# Patient Record
Sex: Male | Born: 1990 | Race: Black or African American | Hispanic: No | Marital: Single | Smoking: Former smoker
Health system: Southern US, Community
[De-identification: ages and names within clinical notes are randomized; demographics above are authoritative.]

## PROBLEM LIST (undated history)

## (undated) DIAGNOSIS — M549 Dorsalgia, unspecified: Secondary | ICD-10-CM

## (undated) DIAGNOSIS — E119 Type 2 diabetes mellitus without complications: Secondary | ICD-10-CM

## (undated) DIAGNOSIS — I10 Essential (primary) hypertension: Secondary | ICD-10-CM

## (undated) DIAGNOSIS — J302 Other seasonal allergic rhinitis: Secondary | ICD-10-CM

## (undated) HISTORY — PX: DENTAL SURGERY: SHX609

---
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
## 2013-06-26 ENCOUNTER — Encounter (HOSPITAL_COMMUNITY): Payer: Self-pay | Admitting: Emergency Medicine

## 2013-06-26 ENCOUNTER — Emergency Department (HOSPITAL_COMMUNITY)
Admission: EM | Admit: 2013-06-26 | Discharge: 2013-06-27 | Disposition: A | Discharge status: Home / Self Care | Payer: Self-pay | Attending: Emergency Medicine | Admitting: Emergency Medicine

## 2013-06-26 DIAGNOSIS — F17200 Nicotine dependence, unspecified, uncomplicated: Secondary | ICD-10-CM | POA: Insufficient documentation

## 2013-06-26 DIAGNOSIS — G44209 Tension-type headache, unspecified, not intractable: Secondary | ICD-10-CM

## 2013-06-26 DIAGNOSIS — B9789 Other viral agents as the cause of diseases classified elsewhere: Secondary | ICD-10-CM | POA: Insufficient documentation

## 2013-06-26 DIAGNOSIS — B349 Viral infection, unspecified: Secondary | ICD-10-CM

## 2013-06-26 DIAGNOSIS — M436 Torticollis: Secondary | ICD-10-CM | POA: Insufficient documentation

## 2013-06-26 DIAGNOSIS — R61 Generalized hyperhidrosis: Secondary | ICD-10-CM | POA: Insufficient documentation

## 2013-06-26 DIAGNOSIS — R109 Unspecified abdominal pain: Secondary | ICD-10-CM | POA: Insufficient documentation

## 2013-06-26 DIAGNOSIS — R5381 Other malaise: Secondary | ICD-10-CM | POA: Insufficient documentation

## 2013-06-26 DIAGNOSIS — M549 Dorsalgia, unspecified: Secondary | ICD-10-CM | POA: Insufficient documentation

## 2013-06-26 HISTORY — DX: Other seasonal allergic rhinitis: J30.2

## 2013-06-26 MED ORDER — IBUPROFEN 800 MG PO TABS
800.0000 mg | ORAL_TABLET | Freq: Once | ORAL | Status: AC
  Administered 2013-06-27: 800 mg via ORAL
  Filled 2013-06-26: qty 1
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
## 2013-06-26 NOTE — ED Notes (Signed)
Pt ambulatory to exam room with steady gait. Pt texting on phone. No acute distress.

## 2013-06-26 NOTE — ED Notes (Signed)
Pt c/o vague s/s, HA, nonspecific abd pain, chills, ?fever at home. Pt denies n/v/d. A & O, NAD.

## 2013-06-27 LAB — URINALYSIS, ROUTINE W REFLEX MICROSCOPIC
Leukocytes, UA: NEGATIVE
Nitrite: NEGATIVE
Specific Gravity, Urine: 1.024 (ref 1.005–1.030)
Urobilinogen, UA: 0.2 mg/dL (ref 0.0–1.0)

## 2013-06-27 LAB — CBC
HCT: 41.8 % (ref 39.0–52.0)
Hemoglobin: 13.7 g/dL (ref 13.0–17.0)
Platelets: 283 10*3/uL (ref 150–400)
RBC: 5.83 MIL/uL — ABNORMAL HIGH (ref 4.22–5.81)

## 2013-06-28 NOTE — ED Provider Notes (Signed)
Medical screening examination/treatment/procedure(s) were performed by non-physician practitioner and as supervising physician I was immediately available for consultation/collaboration.  Media Pizzini M Xandria Gallaga, MD 06/28/13 2039 

## 2013-07-07 NOTE — ED Provider Notes (Signed)
Medical screening examination/treatment/procedure(s) were performed by non-physician practitioner and as supervising physician I was immediately available for consultation/collaboration.   Chaneka Trefz M Evelean Bigler, MD 07/07/13 1337 

## 2013-07-17 ENCOUNTER — Emergency Department (INDEPENDENT_AMBULATORY_CARE_PROVIDER_SITE_OTHER)
Admission: EM | Admit: 2013-07-17 | Discharge: 2013-07-17 | Disposition: A | Discharge status: Home / Self Care | Payer: Self-pay | Source: Home / Self Care | Attending: Emergency Medicine | Admitting: Emergency Medicine

## 2013-07-17 ENCOUNTER — Encounter (HOSPITAL_COMMUNITY): Payer: Self-pay | Admitting: Emergency Medicine

## 2013-07-17 DIAGNOSIS — M545 Low back pain, unspecified: Secondary | ICD-10-CM

## 2013-07-17 MED ORDER — TRAMADOL HCL 50 MG PO TABS: 100.0000 mg | ORAL_TABLET | Freq: Three times a day (TID) | ORAL | Status: DC | PRN

## 2013-07-17 MED ORDER — METHOCARBAMOL 500 MG PO TABS: 500.0000 mg | ORAL_TABLET | Freq: Three times a day (TID) | ORAL | Status: DC

## 2013-07-17 MED ORDER — MELOXICAM 15 MG PO TABS: 15.0000 mg | ORAL_TABLET | Freq: Every day | ORAL | Status: DC
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
## 2013-07-17 NOTE — Discharge Instructions (Signed)
Do exercises twice daily followed by moist heat for 15 minutes. ° ° ° ° ° °Try to be as active as possible. ° °If no better in 2 weeks, follow up with orthopedist. ° ° °
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
## 2013-08-12 ENCOUNTER — Emergency Department (HOSPITAL_COMMUNITY)
Admission: EM | Admit: 2013-08-12 | Discharge: 2013-08-12 | Disposition: A | Discharge status: Home / Self Care | Payer: Self-pay | Attending: Emergency Medicine | Admitting: Emergency Medicine

## 2013-08-12 ENCOUNTER — Encounter (HOSPITAL_COMMUNITY): Payer: Self-pay | Admitting: Emergency Medicine

## 2013-08-12 ENCOUNTER — Emergency Department (HOSPITAL_COMMUNITY): Payer: Self-pay

## 2013-08-12 DIAGNOSIS — F17200 Nicotine dependence, unspecified, uncomplicated: Secondary | ICD-10-CM | POA: Insufficient documentation

## 2013-08-12 DIAGNOSIS — M25579 Pain in unspecified ankle and joints of unspecified foot: Secondary | ICD-10-CM | POA: Insufficient documentation

## 2013-08-12 DIAGNOSIS — M25572 Pain in left ankle and joints of left foot: Secondary | ICD-10-CM

## 2013-08-12 MED ORDER — IBUPROFEN 800 MG PO TABS: 800.0000 mg | ORAL_TABLET | Freq: Three times a day (TID) | ORAL | Status: DC | PRN

## 2013-08-12 NOTE — ED Notes (Addendum)
Pt states he was wrestling and hurt his right ankle last week. Pt now has a knot on his ankle and the pain has moved up to his right leg.

## 2013-08-12 NOTE — Discharge Instructions (Signed)
Read the information below.  Use the prescribed medication as directed.  Please discuss all new medications with your pharmacist.  You may return to the Emergency Department at any time for worsening condition or any new symptoms that concern you.  If you develop uncontrolled pain, weakness or numbness of the extremity, severe discoloration of the skin, or you are unable to walk, return to the ER for a recheck.    ° ° °Ankle Pain °Ankle pain is a common symptom. The bones, cartilage, tendons, and muscles of the ankle joint perform a lot of work each day. The ankle joint holds your body weight and allows you to move around. Ankle pain can occur on either side or back of 1 or both ankles. Ankle pain may be sharp and burning or dull and aching. There may be tenderness, stiffness, redness, or warmth around the ankle. The pain occurs more often when a person walks or puts pressure on the ankle. °CAUSES  °There are many reasons ankle pain can develop. It is important to work with your caregiver to identify the cause since many conditions can impact the bones, cartilage, muscles, and tendons. Causes for ankle pain include: °· Injury, including a break (fracture), sprain, or strain often due to a fall, sports, or a high-impact activity. °· Swelling (inflammation) of a tendon (tendonitis). °· Achilles tendon rupture. °· Ankle instability after repeated sprains and strains. °· Poor foot alignment. °· Pressure on a nerve (tarsal tunnel syndrome). °· Arthritis in the ankle or the lining of the ankle. °· Crystal formation in the ankle (gout or pseudogout). °DIAGNOSIS  °A diagnosis is based on your medical history, your symptoms, results of your physical exam, and results of diagnostic tests. Diagnostic tests may include X-ray exams or a computerized magnetic scan (magnetic resonance imaging, MRI). °TREATMENT  °Treatment will depend on the cause of your ankle pain and may include: °· Keeping pressure off the ankle and limiting  activities. °· Using crutches or other walking support (a cane or brace). °· Using rest, ice, compression, and elevation. °· Participating in physical therapy or home exercises. °· Wearing shoe inserts or special shoes. °· Losing weight. °· Taking medications to reduce pain or swelling or receiving an injection. °· Undergoing surgery. °HOME CARE INSTRUCTIONS  °· Only take over-the-counter or prescription medicines for pain, discomfort, or fever as directed by your caregiver. °· Put ice on the injured area. °· Put ice in a plastic bag. °· Place a towel between your skin and the bag. °· Leave the ice on for 15-20 minutes at a time, 03-04 times a day. °· Keep your leg raised (elevated) when possible to lessen swelling. °· Avoid activities that cause ankle pain. °· Follow specific exercises as directed by your caregiver. °· Record how often you have ankle pain, the location of the pain, and what it feels like. This information may be helpful to you and your caregiver. °· Ask your caregiver about returning to work or sports and whether you should drive. °· Follow up with your caregiver for further examination, therapy, or testing as directed. °SEEK MEDICAL CARE IF:  °· Pain or swelling continues or worsens beyond 1 week. °· You have an oral temperature above 102° F (38.9° C). °· You are feeling unwell or have chills. °· You are having an increasingly difficult time with walking. °· You have loss of sensation or other new symptoms. °· You have questions or concerns. °MAKE SURE YOU:  °· Understand these instructions. °· Will   watch your condition.  Will get help right away if you are not doing well or get worse. Document Released: 12/15/2009 Document Revised: 09/19/2011 Document Reviewed: 12/15/2009 [facility] Patient Information 2014 [place], [place].   Emergency Department Resource Guide 1) Find a Doctor and Pay Out of Pocket Although you won't have to find out who is covered by your insurance plan, it is a good  idea to ask around and get recommendations. You will then need to call the office and see if the doctor you have chosen will accept you as a new patient and what types of options they offer for patients who are self-pay. Some doctors offer discounts or will set up payment plans for their patients who do not have insurance, but you will need to ask so you aren't surprised when you get to your appointment.  2) Contact Your Local Health Department Not all health departments have doctors that can see patients for sick visits, but many do, so it is worth a call to see if yours does. If you don't know where your local health department is, you can check in your phone book. The CDC also has a tool to help you locate your state's health department, and many state websites also have listings of all of their local health departments.  3) Find a Walk-in Clinic If your illness is not likely to be very severe or complicated, you may want to try a walk in clinic. These are popping up all over the country in pharmacies, drugstores, and shopping centers. They're usually staffed by nurse practitioners or physician assistants that have been trained to treat common illnesses and complaints. They're usually fairly quick and inexpensive. However, if you have serious medical issues or chronic medical problems, these are probably not your best option.  No Primary Care Doctor: - Call Health Connect at  [phone] - they can help you locate a primary care doctor that  accepts your insurance, provides certain services, etc. - Physician Referral Service- [phone]  Chronic Pain Problems: Organization         Address  Phone   Notes  [facility]  [phone] Patients need to be referred by their primary care doctor.   Medication Assistance: Organization         Address  Phone   Notes  [facility] Medication [facility] [address] [place] [place], [place] [address] [phone]  --Must be a resident of [facility] -- Must have NO insurance coverage whatsoever (no Medicaid/ Medicare, etc.) -- The pt. MUST have a primary care doctor that directs their care regularly and follows them in the community   [facility]ist  [phone]   [employer]  [phone]    Agencies that provide inexpensive medical care: Organization         Address  Phone   Notes  [facility]  [phone]   [facility]    [phone]   [facility] [address] [place], [place] [address] [phone]   [facility] [address] [place] [place] [phone]   Planned Parenthood    [phone]   [facility]    [phone]   [facility]  [address], [place] Phone:  [phone], Fax:  [phone] Hours of Operation:  9 am - 6 pm, M-F.  Also accepts Medicaid/Medicare and self-pay.  [facility]  [address], [place] Phone: [phone], Fax: [phone]. Hours of Operation:  8:30 am - 5:30 pm, M-F.  Also accepts Medicaid and self-pay.  [facility] [place] [address] [place] Phone: [phone]   [facility] [address] Natasha Bence [place], [place] [phone], Ext. [phone] Mondays & Thursdays: 7-9 AM.  First 15 patients are seen on a first come, first serve basis.    Medicaid-accepting [facility] Providers:  Organization         Address  Phone   Notes  [facility] [address], [place] [phone] Also accepts self-pay patients.  [facility] [address] Laurell Josephs [place], [place]  [phone]   [facility] [address], [place] [phone]   [facility] [address] [place] [phone]   Renaye Rakers [address], [place]   [phone] Only  accepts [place] patients after they have their name applied to their card.   Self-Pay (no insurance) in [facility]:  Organization         Address  Phone   Notes  Sickle Cell Patients, [facility] [address] [place], [place] [phone]   [facility] [address] [place], [place] [phone]   [facility]  [address], [place] [phone]   [facility]/Dr. Osei-Bonsu  [address] [place] or [address], [place] [phone] Phone number for both [place] and [place] locations is the same.  [facility] and [facility] [address] [place] [phone]   [facility] [address] [place] or [address] [phone]   [facility] [address], [place] [phone], phone; [phone], fax Sees patients 1st and 3rd Saturday of every month.  Must not qualify for public or private insurance (i.e. Medicaid, Medicare, [place] Health Choice, Veterans' Benefits)  Household income should be no more than 200% of the poverty level The clinic cannot treat you if you are pregnant or think you are pregnant  Sexually transmitted diseases are not treated at the clinic.    Dental Care: Organization         Address  Phone  Notes  [facility] Department of [facility] [address] [place], [place] [phone] Accepts children up to age 45 who are enrolled in [place] or [place] Health Choice; pregnant women with a Medicaid card; and children who have applied for Medicaid or [place] Health Choice, but were declined, whose parents can pay a reduced fee at time of service.  [facility]  [address], [place] [phone] Accepts children up to age 58 who are enrolled in [place] or [place] Health Choice; pregnant  women with a Medicaid card; and children who have applied for Medicaid or  Health Choice, but were declined, whose parents can pay a reduced fee at time of service.  [facility]  [address] [place], [place] [phone] Patients are seen by appointment only. Walk-ins are not accepted. [facility] will see patients 41 years of age and older. Monday - Tuesday (8am-5pm) Most Wednesdays (8:30-5pm) $30 per visit, cash only  [facility]  [address], [facility] [phone] Patients are seen by appointment only. Walk-ins are not accepted. [facility] will see patients 92 years of age and older. One Wednesday Evening (Monthly: Volunteer Based).  $30 per visit, cash only  [employer]  [phone] for adults; Children under age 40, call Graduate Pediatric Dentistry at [phone]. Children aged 60-14, please call [phone] to request a pediatric application.  Dental services are provided in all areas of dental care including fillings, crowns and bridges, complete and partial dentures, implants, gum treatment, root canals, and extractions. Preventive care is also provided. Treatment is provided to both adults and children. Patients are selected via a lottery and there is often a waiting list.   [facility] [address], [place]  [phone] [email or web address]   [facility] [address] [place], [place] [phone], Ext. [phone] Second and Fourth Thursday of each month, opens at 6:30 AM; Clinic ends at 9 AM.  Patients are seen on a first-come first-served basis, and a limited number are seen during each clinic.   [facility]  [address] Ether Griffins [place], [place] [phone]   Eligibility Requirements You must have lived in [place], [place], or [place] counties for at least the last three months.   You cannot be eligible for state or federal sponsored  [place], including [employer], [place], or [employer].   You generally cannot be eligible for healthcare insurance through your employer.    How to apply: Eligibility screenings are held every Tuesday and Wednesday afternoon from 1:00 pm until 4:00 pm. You do not need an appointment for the interview!  [facility] [address] [place], [place] [phone]   [facility] Health Department  [phone]   [facility]  [phone]   [facility]  [phone]    Behavioral Health Resources in the Community: Intensive Outpatient Programs Organization         Address  Phone  Notes  [facility] [address], [place], [place] [phone]   [facility] Outpatient [address], [place], [place] [phone]   [facility] [address] Lizandro Spellman [place], [place]  [phone]   [facility] Mental Health [address],  [place], [place] [phone] or [phone]   Substance Abuse Resources Organization         Address  Phone  Notes  [facility]  [phone]   [facility]  [phone]   [facility] [place]  [phone]   Floydene Flock  [phone]   Residential & Outpatient Substance Abuse Program  [phone]   Psychological Services Organization         Address  Phone  Notes  [facility]  [phone]   [facility]  [phone]   [facility] [address] [place] [phone] or [phone]    Mobile Crisis Teams Organization         Address  Phone  Notes  [facility], Mobile Crisis Care Unit  [phone]   [facility]  [address] [place], [place] [phone]   Doristine Locks [address] [place] [phone]    Self-Help/Support Groups Organization         Address  Phone              Notes  [facility] [place] - variety of support groups  336- [phone] Call for more information  Narcotics Anonymous (NA), [facility] [address], [place]  2 meetings at this location   Statisticianesidential Treatment Programs Organization         Address  Phone  Notes  [facility] [address],    [place]  [phone]   [facility]  [address], [place] [phone], [place], [place] [phone]   [facility] [address], [place] [phone] Admissions: 8am-3pm M-F  [facility] [address]    [place], [place] [phone]   [facility] [address], [place], [place] [phone]   [facility] [address]  [place], [place] [phone]   [facility] - Intensive Outpatient [address], [place], [place] [phone]   [facility] ([facility] [address]  [place], [place] [phone] or [phone]   [facility] ([facility]) [address] [place], [place] [phone] Accepts Medicaid  [facility] [address]  [place] [phone] Substance Abuse/Addiction Treatment   [facility] Resources Organization         Address  Phone  Notes  [facility]  [phone]) [phone]   Angie FavaJulie Brannon, PhD [address] [place], [place]   [phone]) [phone] or [phone](336) [phone]   [facility]   [address] [place], [place] [phone]) [phone]   [facility] [address], [place], [place] [phone](336) [phone] Insurance/Medicaid/sponsorship through [facility]  [facility] [address]                                    [place], [place] [phone](336) [phone] Therapy/tele-psych/case  [facility] [address]   [place], [place] [phone]) [phone]    Dr. Lolly MustacheArfeen  [phone](336) [phone]   [facility]  [employer] [facility] 1) [address], [place] 2) [address], [place] 3)  [address], [place] [phone](336) [phone]) [phone]  [phone](336) [phone]   [facility] Child Abuse Hotline [phone]) [phone] or [phone]) [phone] (After Hours)

## 2013-08-12 NOTE — ED Provider Notes (Signed)
CSN: [id number]     Arrival date & time 08/12/13  1108 History   First MD Initiated Contact with Patient 08/12/13 1121     Chief Complaint  Patient presents with  . Ankle Pain  . Leg Pain   (Consider location/radiation/quality/duration/timing/severity/associated sxs/prior Treatment) HPI Patient presents with left ankle pain and small area of swelling over medial aspect.  States it has been hurting for a week.  Pain is constant, worse with palpation and walking.  He thinks he twisted his ankle but does not recall the specifics of the event. Denies fevers, weakness or numbness.  He is not active in sports.    Past Medical History  Diagnosis Date  . Seasonal allergies    History reviewed. No pertinent past surgical history. No family history on file. History  Substance Use Topics  . Smoking status: Current Every Day Smoker -- 1.00 packs/day    Types: Cigarettes  . Smokeless tobacco: Not on file  . Alcohol Use: Yes     Comment: occasional    Review of Systems  Constitutional: Negative for fever.  Musculoskeletal: Positive for arthralgias.  Skin: Negative for color change, pallor and wound.  Allergic/Immunologic: Negative for immunocompromised state.  Neurological: Negative for weakness and numbness.    Allergies  Review of patient's allergies indicates no known allergies.  Home Medications   Current Outpatient Rx  Name  Route  Sig  Dispense  Refill  . traMADol (ULTRAM) 50 MG tablet   Oral   Take 2 tablets (100 mg total) by mouth every 8 (eight) hours as needed.   30 tablet   0    BP 126/76  Pulse 86  Temp(Src) 98 F (36.7 C)  Resp 18  SpO2 100% Physical Exam  Nursing note and vitals reviewed. Constitutional: He appears well-developed and well-nourished. No distress.  HENT:  Head: Normocephalic and atraumatic.  Neck: Neck supple.  Pulmonary/Chest: Effort normal.  Musculoskeletal:       Left ankle: He exhibits no ecchymosis, no deformity, no laceration and  normal pulse. Tenderness. Medial malleolus tenderness found.       Left lower leg: Normal.       Left foot: Normal.       Feet:  Neurological: He is alert.  Skin: He is not diaphoretic.  Ankle is stable.  No pain or laxity of joint with stress in any direction.  Achilles is intact.   ED Course  Procedures (including critical care time) Labs Review Labs Reviewed - No data to display Imaging Review Dg Ankle Complete Left  08/12/2013   CLINICAL DATA:  Ankle pain following twisting injury 5 days ago.  EXAM: LEFT ANKLE COMPLETE - 3+ VIEW  COMPARISON:  Foot radiographs 03/30/2004.  FINDINGS: The mineralization and alignment are normal. There is no evidence of acute fracture or dislocation. The joint spaces are maintained. No focal soft tissue swelling identified.  IMPRESSION: No acute osseous findings.   Electronically Signed   By: Roxy Horseman M.D.   On: 08/12/2013 11:45    EKG Interpretation   None       MDM   1. Left ankle pain    Patient with pain in left ankle following possible twisting injury last week. Xray negative.  Neurovascularly intact.  Joint is stable.  D/C home with NSAIDs, RICE recommendations.  PCP followup.  Discussed result, findings, treatment, and follow up  with patient.  Pt given return precautions.  Pt verbalizes understanding and agrees with plan.  Trixie Dredgemily Niza Soderholm, PA-C 08/12/13 1203

## 2013-08-12 NOTE — Progress Notes (Signed)
[facility] CL provided pt with a list of primary care resources and ACA information.  °

## 2013-08-13 NOTE — ED Provider Notes (Signed)
Medical screening examination/treatment/procedure(s) were performed by non-physician practitioner and as supervising physician I was immediately available for consultation/collaboration.  EKG Interpretation   None         Marguarite Markov S Fount Bahe, MD 08/13/13 1239 

## 2013-10-02 ENCOUNTER — Emergency Department (HOSPITAL_COMMUNITY)
Admission: EM | Admit: 2013-10-02 | Discharge: 2013-10-02 | Disposition: A | Discharge status: Home / Self Care | Payer: Self-pay | Attending: Emergency Medicine | Admitting: Emergency Medicine

## 2013-10-02 ENCOUNTER — Encounter (HOSPITAL_COMMUNITY): Payer: Self-pay | Admitting: Emergency Medicine

## 2013-10-02 DIAGNOSIS — R51 Headache: Secondary | ICD-10-CM | POA: Insufficient documentation

## 2013-10-02 DIAGNOSIS — F1210 Cannabis abuse, uncomplicated: Secondary | ICD-10-CM | POA: Insufficient documentation

## 2013-10-02 DIAGNOSIS — J309 Allergic rhinitis, unspecified: Secondary | ICD-10-CM | POA: Insufficient documentation

## 2013-10-02 DIAGNOSIS — M549 Dorsalgia, unspecified: Secondary | ICD-10-CM | POA: Insufficient documentation

## 2013-10-02 DIAGNOSIS — F17200 Nicotine dependence, unspecified, uncomplicated: Secondary | ICD-10-CM | POA: Insufficient documentation

## 2013-10-02 DIAGNOSIS — G8929 Other chronic pain: Secondary | ICD-10-CM | POA: Insufficient documentation

## 2013-10-02 DIAGNOSIS — R42 Dizziness and giddiness: Secondary | ICD-10-CM | POA: Insufficient documentation

## 2013-10-02 MED ORDER — TRAMADOL HCL 50 MG PO TABS: 50.0000 mg | ORAL_TABLET | Freq: Four times a day (QID) | ORAL | Status: DC | PRN

## 2013-10-02 MED ORDER — MECLIZINE HCL 25 MG PO TABS
25.0000 mg | ORAL_TABLET | Freq: Once | ORAL | Status: AC
  Administered 2013-10-02: 25 mg via ORAL
  Filled 2013-10-02: qty 1

## 2013-10-02 MED ORDER — MECLIZINE HCL 12.5 MG PO TABS: 12.5000 mg | ORAL_TABLET | Freq: Three times a day (TID) | ORAL | Status: DC | PRN

## 2013-10-02 NOTE — ED Provider Notes (Signed)
CSN: [id number]     Arrival date & time 10/02/13  1053 History   First MD Initiated Contact with Patient 10/02/13 1128     Chief Complaint  Patient presents with  . Dizziness  . Back Pain  . Headache     (Consider location/radiation/quality/duration/timing/severity/associated sxs/prior Treatment) HPI Comments: Patient is a 23 year old male with a past medical history of chronic back pain and seasonal allergies who presents to the emergency department complaining of an exacerbation of his chronic back pain over the past few days. Patient states he has had back pain ongoing for the past several months, was put on tramadol which relieved his pain, however he only has a few pills left and is worried what he will do when he runs out. Denies any new injury or trauma. Denies pain, numbness or tingling radiating down his extremities, no loss of control bowels or bladder or saddle anesthesia. Denies fever, chills or night sweats. Also complaining of 2 episodes of dizziness, symptoms are very vague and patient is unable to describe how he is feeling. States when he was sitting watching TV last night, he felt "dizzy", biopsy hasn't been eating drink the dizziness subsided.. Symptoms returned today, he cannot recall any relation to position. Currently he is slightly dizzy. Denies chest pain, shortness of breath, vision change, weakness. No family history of sudden cardiac death.  Patient is a 23 y.o. male presenting with dizziness, back pain, and headaches. The history is provided by the patient.  Dizziness Associated symptoms: headaches   Back Pain Associated symptoms: headaches   Headache Associated symptoms: back pain and dizziness     Past Medical History  Diagnosis Date  . Seasonal allergies    History reviewed. No pertinent past surgical history. History reviewed. No pertinent family history. History  Substance Use Topics  . Smoking status: Current Every Day Smoker -- 1.00 packs/day     Types: Cigarettes  . Smokeless tobacco: Not on file  . Alcohol Use: Yes     Comment: occasional    Review of Systems  Musculoskeletal: Positive for back pain.  Neurological: Positive for dizziness and headaches.  All other systems reviewed and are negative.      Allergies  Review of patient's allergies indicates no known allergies.  Home Medications   Current Outpatient Rx  Name  Route  Sig  Dispense  Refill  . MELOXICAM PO   Oral   Take 1 tablet by mouth daily as needed (muscle spasm).         . traMADol (ULTRAM) 50 MG tablet   Oral   Take 100 mg by mouth every 8 (eight) hours as needed (pain).         . meclizine (ANTIVERT) 12.5 MG tablet   Oral   Take 1 tablet (12.5 mg total) by mouth 3 (three) times daily as needed for dizziness.   30 tablet   0   . traMADol (ULTRAM) 50 MG tablet   Oral   Take 1 tablet (50 mg total) by mouth every 6 (six) hours as needed.   15 tablet   0    BP 140/88  Pulse 79  Temp(Src) 98.1 F (36.7 C) (Oral)  Resp 18  SpO2 100% Physical Exam  Nursing note and vitals reviewed. Constitutional: He is oriented to person, place, and time. He appears well-developed and well-nourished. No distress.  HENT:  Head: Normocephalic and atraumatic.  Mouth/Throat: Oropharynx is clear and moist.  Eyes: Conjunctivae and EOM are normal. Pupils are  equal, round, and reactive to light.  Neck: Normal range of motion. Neck supple. No JVD present.  Cardiovascular: Normal rate, regular rhythm, normal heart sounds and intact distal pulses.   No extremity edema.  Pulmonary/Chest: Effort normal and breath sounds normal. No respiratory distress.  Abdominal: Soft. Bowel sounds are normal. There is no tenderness.  Musculoskeletal: Normal range of motion. He exhibits no edema.  Neurological: He is alert and oriented to person, place, and time. He has normal strength. No sensory deficit. He displays a negative Romberg sign. Coordination and gait normal.   Speech fluent, goal oriented. Moves limbs without ataxia. Equal grip strength bilateral. Strength upper and lower extremities 5/5 and equal bilateral. Normal gait. Unable to reproduce dizziness.  Skin: Skin is warm and dry. He is not diaphoretic.  Psychiatric: He has a normal mood and affect. His behavior is normal.    ED Course  Procedures (including critical care time) Labs Review Labs Reviewed - No data to display Imaging Review No results found.   EKG Interpretation   Date/Time:  Wednesday October 02 2013 13:28:53 EDT Ventricular Rate:  78 PR Interval:  153 QRS Duration: 79 QT Interval:  362 QTC Calculation: 412 R Axis:   66 Text Interpretation:  Sinus rhythm No previous ECGs available Confirmed by  ZACKOWSKI  MD, SCOTT ([id number]) on 10/02/2013 1:38:55 PM      MDM   Final diagnoses:  Dizziness  Chronic back pain   Pt presenting with dizziness and chronic back pain. He is well appearing and in NAD. Needs refill on tramadol, he does not have PCP. Regarding dizziness, very vague, not reproducible. EKG unremarkable. Symptoms resolved with meclizine. Normal neuro exam, non-focal. Doubt central vertigo. No nystagmus. Pt stable for discharge, will d/c with meclizine and tramadol. Advised pt to f/u with PCP, [facility]. Return precautions given. Patient states understanding of treatment care plan and is agreeable.   Trevor MaceRobyn M Albert, PA-C 10/02/13 1409

## 2013-10-02 NOTE — ED Notes (Signed)
Pt c/o generalized back pain that is chronic in nature x several months; pt sts generalized HA today

## 2013-10-02 NOTE — ED Provider Notes (Signed)
Medical screening examination/treatment/procedure(s) were performed by non-physician practitioner and as supervising physician I was immediately available for consultation/collaboration.   EKG Interpretation   Date/Time:  Wednesday October 02 2013 13:28:53 EDT Ventricular Rate:  78 PR Interval:  153 QRS Duration: 79 QT Interval:  362 QTC Calculation: 412 R Axis:   66 Text Interpretation:  Sinus rhythm No previous ECGs available Confirmed by  Deretha EmoryZACKOWSKI  MD, Sonya Gunnoe ([id number]) on 10/02/2013 1:38:55 PM        Shelda JakesScott W. Jermale Crass, MD 10/02/13 [phone]

## 2013-10-02 NOTE — Discharge Instructions (Signed)
Take meclizine and tramadol as prescribed as needed for dizziness and back pain respectively.  Back Pain, Adult Low back pain is very common. About 1 in 5 people have back pain.The cause of low back pain is rarely dangerous. The pain often gets better over time.About half of people with a sudden onset of back pain feel better in just 2 weeks. About 8 in 10 people feel better by 6 weeks.  CAUSES Some common causes of back pain include:  Strain of the muscles or ligaments supporting the spine.  Wear and tear (degeneration) of the spinal discs.  Arthritis.  Direct injury to the back. DIAGNOSIS Most of the time, the direct cause of low back pain is not known.However, back pain can be treated effectively even when the exact cause of the pain is unknown.Answering your caregiver's questions about your overall health and symptoms is one of the most accurate ways to make sure the cause of your pain is not dangerous. If your caregiver needs more information, he or she may order lab work or imaging tests (X-rays or MRIs).However, even if imaging tests show changes in your back, this usually does not require surgery. HOME CARE INSTRUCTIONS For many people, back pain returns.Since low back pain is rarely dangerous, it is often a condition that people can learn to [facility] their own.   Remain active. It is stressful on the back to sit or stand in one place. Do not sit, drive, or stand in one place for more than 30 minutes at a time. Take short walks on level surfaces as soon as pain allows.Try to increase the length of time you walk each day.  Do not stay in bed.Resting more than 1 or 2 days can delay your recovery.  Do not avoid exercise or work.Your body is made to move.It is not dangerous to be active, even though your back may hurt.Your back will likely heal faster if you return to being active before your pain is gone.  Pay attention to your body when you bend and lift. Many people have  less discomfortwhen lifting if they bend their knees, keep the load close to their bodies,and avoid twisting. Often, the most comfortable positions are those that put less stress on your recovering back.  Find a comfortable position to sleep. Use a firm mattress and lie on your side with your knees slightly bent. If you lie on your back, put a pillow under your knees.  Only take over-the-counter or prescription medicines as directed by your caregiver. Over-the-counter medicines to reduce pain and inflammation are often the most helpful.Your caregiver may prescribe muscle relaxant drugs.These medicines help dull your pain so you can more quickly return to your normal activities and healthy exercise.  Put ice on the injured area.  Put ice in a plastic bag.  Place a towel between your skin and the bag.  Leave the ice on for 15-20 minutes, 03-04 times a day for the first 2 to 3 days. After that, ice and heat may be alternated to reduce pain and spasms.  Ask your caregiver about trying back exercises and gentle massage. This may be of some benefit.  Avoid feeling anxious or stressed.Stress increases muscle tension and can worsen back pain.It is important to recognize when you are anxious or stressed and learn ways to manage it.Exercise is a great option. SEEK MEDICAL CARE IF:  You have pain that is not relieved with rest or medicine.  You have pain that does not improve in  1 week.  You have new symptoms.  You are generally not feeling well. SEEK IMMEDIATE MEDICAL CARE IF:   You have pain that radiates from your back into your legs.  You develop new bowel or bladder control problems.  You have unusual weakness or numbness in your arms or legs.  You develop nausea or vomiting.  You develop abdominal pain.  You feel faint. Document Released: 06/27/2005 Document Revised: 12/27/2011 Document Reviewed: 11/15/2010 [facility] Patient Information 2014 [place].  Dizziness Dizziness is a common problem. It is a feeling of unsteadiness or lightheadedness. You may feel like you are about to faint. Dizziness can lead to injury if you stumble or fall. A person of any age group can suffer from dizziness, but dizziness is more common in older adults. CAUSES  Dizziness can be caused by many different things, including:  Middle ear problems.  Standing for too long.  Infections.  An allergic reaction.  Aging.  An emotional response to something, such as the sight of blood.  Side effects of medicines.  Fatigue.  Problems with circulation or blood pressure.  Excess use of alcohol, medicines, or illegal drug use.  Breathing too fast (hyperventilation).  An arrhythmia or problems with your heart rhythm.  Low red blood cell count (anemia).  Pregnancy.  Vomiting, diarrhea, fever, or other illnesses that cause dehydration.  Diseases or conditions such as Parkinson's disease, high blood pressure (hypertension), diabetes, and thyroid problems.  Exposure to extreme heat. DIAGNOSIS  To find the cause of your dizziness, your caregiver may do a physical exam, lab tests, radiologic imaging scans, or an electrocardiography test (ECG).  TREATMENT  Treatment of dizziness depends on the cause of your symptoms and can vary greatly. HOME CARE INSTRUCTIONS   Drink enough fluids to keep your urine clear or pale yellow. This is especially important in very hot weather. In the elderly, it is also important in cold weather.  If your dizziness is caused by medicines, take them exactly as directed. When taking blood pressure medicines, it is especially important to get up slowly.  Rise slowly from chairs and steady yourself until you feel okay.  In the morning, first sit up on the side of the bed. When this seems okay, stand slowly while holding onto something until you know your balance is fine.  If you need to stand in one place for a long time, be  sure to move your legs often. Tighten and relax the muscles in your legs while standing.  If dizziness continues to be a problem, have someone stay with you for a day or two. Do this until you feel you are well enough to stay alone. Have the person call your caregiver if he or she notices changes in you that are concerning.  Do not drive or use heavy machinery if you feel dizzy.  Do not drink alcohol. SEEK IMMEDIATE MEDICAL CARE IF:   Your dizziness or lightheadedness gets worse.  You feel nauseous or vomit.  You develop problems with talking, walking, weakness, or using your arms, hands, or legs.  You are not thinking clearly or you have difficulty forming sentences. It may take a friend or family member to determine if your thinking is normal.  You develop chest pain, abdominal pain, shortness of breath, or sweating.  Your vision changes.  You notice any bleeding.  You have side effects from medicine that seems to be getting worse rather than better. MAKE SURE YOU:   Understand these instructions.  Will watch your condition.  Will get help right away if you are not doing well or get worse. Document Released: 12/21/2000 Document Revised: 09/19/2011 Document Reviewed: 01/14/2011 [facility] Patient Information 2014 [place], [place].

## 2013-11-06 ENCOUNTER — Emergency Department (INDEPENDENT_AMBULATORY_CARE_PROVIDER_SITE_OTHER)
Admission: EM | Admit: 2013-11-06 | Discharge: 2013-11-06 | Discharge status: Against Medical Advice | Payer: Self-pay | Source: Home / Self Care | Attending: Family Medicine | Admitting: Family Medicine

## 2013-11-06 NOTE — ED Notes (Signed)
Called patient twice and did not get an answer

## 2013-11-14 ENCOUNTER — Encounter (HOSPITAL_COMMUNITY): Payer: Self-pay | Admitting: Emergency Medicine

## 2013-11-14 ENCOUNTER — Emergency Department (HOSPITAL_COMMUNITY)
Admission: EM | Admit: 2013-11-14 | Discharge: 2013-11-14 | Disposition: A | Discharge status: Home / Self Care | Payer: Self-pay | Attending: Emergency Medicine | Admitting: Emergency Medicine

## 2013-11-14 DIAGNOSIS — G8929 Other chronic pain: Secondary | ICD-10-CM | POA: Insufficient documentation

## 2013-11-14 DIAGNOSIS — M545 Low back pain, unspecified: Secondary | ICD-10-CM | POA: Insufficient documentation

## 2013-11-14 DIAGNOSIS — M549 Dorsalgia, unspecified: Secondary | ICD-10-CM

## 2013-11-14 DIAGNOSIS — F17200 Nicotine dependence, unspecified, uncomplicated: Secondary | ICD-10-CM | POA: Insufficient documentation

## 2013-11-14 DIAGNOSIS — M546 Pain in thoracic spine: Secondary | ICD-10-CM | POA: Insufficient documentation

## 2013-11-14 HISTORY — DX: Dorsalgia, unspecified: M54.9

## 2013-11-14 MED ORDER — MELOXICAM 7.5 MG PO TABS: 15.0000 mg | ORAL_TABLET | Freq: Every day | ORAL | Status: DC

## 2013-11-14 MED ORDER — HYDROCODONE-ACETAMINOPHEN 5-325 MG PO TABS: ORAL_TABLET | ORAL | Status: DC

## 2013-11-14 MED ORDER — CYCLOBENZAPRINE HCL 10 MG PO TABS: 10.0000 mg | ORAL_TABLET | Freq: Two times a day (BID) | ORAL | Status: DC | PRN
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
## 2013-11-14 NOTE — Discharge Instructions (Signed)
Back Pain, Adult Low back pain is very common. About 1 in 5 people have back pain.The cause of low back pain is rarely dangerous. The pain often gets better over time.About half of people with a sudden onset of back pain feel better in just 2 weeks. About 8 in 10 people feel better by 6 weeks.  CAUSES Some common causes of back pain include:  Strain of the muscles or ligaments supporting the spine.  Wear and tear (degeneration) of the spinal discs.  Arthritis.  Direct injury to the back. DIAGNOSIS Most of the time, the direct cause of low back pain is not known.However, back pain can be treated effectively even when the exact cause of the pain is unknown.Answering your caregiver's questions about your overall health and symptoms is one of the most accurate ways to make sure the cause of your pain is not dangerous. If your caregiver needs more information, he or she may order lab work or imaging tests (X-rays or MRIs).However, even if imaging tests show changes in your back, this usually does not require surgery. HOME CARE INSTRUCTIONS For many people, back pain returns.Since low back pain is rarely dangerous, it is often a condition that people can learn to [facility] their own.   Remain active. It is stressful on the back to sit or stand in one place. Do not sit, drive, or stand in one place for more than 30 minutes at a time. Take short walks on level surfaces as soon as pain allows.Try to increase the length of time you walk each day.  Do not stay in bed.Resting more than 1 or 2 days can delay your recovery.  Do not avoid exercise or work.Your body is made to move.It is not dangerous to be active, even though your back may hurt.Your back will likely heal faster if you return to being active before your pain is gone.  Pay attention to your body when you bend and lift. Many people have less discomfortwhen lifting if they bend their knees, keep the load close to their bodies,and  avoid twisting. Often, the most comfortable positions are those that put less stress on your recovering back.  Find a comfortable position to sleep. Use a firm mattress and lie on your side with your knees slightly bent. If you lie on your back, put a pillow under your knees.  Only take over-the-counter or prescription medicines as directed by your caregiver. Over-the-counter medicines to reduce pain and inflammation are often the most helpful.Your caregiver may prescribe muscle relaxant drugs.These medicines help dull your pain so you can more quickly return to your normal activities and healthy exercise.  Put ice on the injured area.  Put ice in a plastic bag.  Place a towel between your skin and the bag.  Leave the ice on for 15-20 minutes, 03-04 times a day for the first 2 to 3 days. After that, ice and heat may be alternated to reduce pain and spasms.  Ask your caregiver about trying back exercises and gentle massage. This may be of some benefit.  Avoid feeling anxious or stressed.Stress increases muscle tension and can worsen back pain.It is important to recognize when you are anxious or stressed and learn ways to manage it.Exercise is a great option. SEEK MEDICAL CARE IF:  You have pain that is not relieved with rest or medicine.  You have pain that does not improve in 1 week.  You have new symptoms.  You are generally not feeling well. SEEK  IMMEDIATE MEDICAL CARE IF:   You have pain that radiates from your back into your legs.  You develop new bowel or bladder control problems.  You have unusual weakness or numbness in your arms or legs.  You develop nausea or vomiting.  You develop abdominal pain.  You feel faint. Document Released: 06/27/2005 Document Revised: 12/27/2011 Document Reviewed: 11/15/2010 [facility] Patient Information 2014 [place], [place].  Back Exercises These exercises may help you when beginning to rehabilitate your injury. Your symptoms may  resolve with or without further involvement from your physician, physical therapist or athletic trainer. While completing these exercises, remember:   Restoring tissue flexibility helps normal motion to return to the joints. This allows healthier, less painful movement and activity.  An effective stretch should be held for at least 30 seconds.  A stretch should never be painful. You should only feel a gentle lengthening or release in the stretched tissue. STRETCH  Extension, Prone on Elbows   Lie on your stomach on the floor, a bed will be too soft. Place your palms about shoulder width apart and at the height of your head.  Place your elbows under your shoulders. If this is too painful, stack pillows under your chest.  Allow your body to relax so that your hips drop lower and make contact more completely with the floor.  Hold this position for __________ seconds.  Slowly return to lying flat on the floor. Repeat __________ times. Complete this exercise __________ times per day.  RANGE OF MOTION  Extension, Prone Press Ups   Lie on your stomach on the floor, a bed will be too soft. Place your palms about shoulder width apart and at the height of your head.  Keeping your back as relaxed as possible, slowly straighten your elbows while keeping your hips on the floor. You may adjust the placement of your hands to maximize your comfort. As you gain motion, your hands will come more underneath your shoulders.  Hold this position __________ seconds.  Slowly return to lying flat on the floor. Repeat __________ times. Complete this exercise __________ times per day.  RANGE OF MOTION- Quadruped, Neutral Spine   Assume a hands and knees position on a firm surface. Keep your hands under your shoulders and your knees under your hips. You may place padding under your knees for comfort.  Drop your head and point your tail bone toward the ground below you. This will round out your low back like an  angry cat. Hold this position for __________ seconds.  Slowly lift your head and release your tail bone so that your back sags into a large arch, like an old horse.  Hold this position for __________ seconds.  Repeat this until you feel limber in your low back.  Now, find your "sweet spot." This will be the most comfortable position somewhere between the two previous positions. This is your neutral spine. Once you have found this position, tense your stomach muscles to support your low back.  Hold this position for __________ seconds. Repeat __________ times. Complete this exercise __________ times per day.  STRETCH  Flexion, Single Knee to Chest   Lie on a firm bed or floor with both legs extended in front of you.  Keeping one leg in contact with the floor, bring your opposite knee to your chest. Hold your leg in place by either grabbing behind your thigh or at your knee.  Pull until you feel a gentle stretch in your low back. Hold  __________ seconds.  Slowly release your grasp and repeat the exercise with the opposite side. Repeat __________ times. Complete this exercise __________ times per day.  STRETCH - Hamstrings, Standing  Stand or sit and extend your right / left leg, placing your foot on a chair or foot stool  Keeping a slight arch in your low back and your hips straight forward.  Lead with your chest and lean forward at the waist until you feel a gentle stretch in the back of your right / left knee or thigh. (When done correctly, this exercise requires leaning only a small distance.)  Hold this position for __________ seconds. Repeat __________ times. Complete this stretch __________ times per day. STRENGTHENING  Deep Abdominals, Pelvic Tilt   Lie on a firm bed or floor. Keeping your legs in front of you, bend your knees so they are both pointed toward the ceiling and your feet are flat on the floor.  Tense your lower abdominal muscles to press your low back into the  floor. This motion will rotate your pelvis so that your tail bone is scooping upwards rather than pointing at your feet or into the floor.  With a gentle tension and even breathing, hold this position for __________ seconds. Repeat __________ times. Complete this exercise __________ times per day.  STRENGTHENING  Abdominals, Crunches   Lie on a firm bed or floor. Keeping your legs in front of you, bend your knees so they are both pointed toward the ceiling and your feet are flat on the floor. Cross your arms over your chest.  Slightly tip your chin down without bending your neck.  Tense your abdominals and slowly lift your trunk high enough to just clear your shoulder blades. Lifting higher can put excessive stress on the low back and does not further strengthen your abdominal muscles.  Control your return to the starting position. Repeat __________ times. Complete this exercise __________ times per day.  STRENGTHENING  Quadruped, Opposite UE/LE Lift   Assume a hands and knees position on a firm surface. Keep your hands under your shoulders and your knees under your hips. You may place padding under your knees for comfort.  Find your neutral spine and gently tense your abdominal muscles so that you can maintain this position. Your shoulders and hips should form a rectangle that is parallel with the floor and is not twisted.  Keeping your trunk steady, lift your right hand no higher than your shoulder and then your left leg no higher than your hip. Make sure you are not holding your breath. Hold this position __________ seconds.  Continuing to keep your abdominal muscles tense and your back steady, slowly return to your starting position. Repeat with the opposite arm and leg. Repeat __________ times. Complete this exercise __________ times per day. Document Released: 07/15/2005 Document Revised: 09/19/2011 Document Reviewed: 10/09/2008 [facility] Patient Information 2014 [place],  [place].  Emergency Department Resource Guide 1) Find a Doctor and Pay Out of Pocket Although you won't have to find out who is covered by your insurance plan, it is a good idea to ask around and get recommendations. You will then need to call the office and see if the doctor you have chosen will accept you as a new patient and what types of options they offer for patients who are self-pay. Some doctors offer discounts or will set up payment plans for their patients who do not have insurance, but you will need to ask so you aren't surprised when  you get to your appointment.  2) Contact Your Local Health Department Not all health departments have doctors that can see patients for sick visits, but many do, so it is worth a call to see if yours does. If you don't know where your local health department is, you can check in your phone book. The CDC also has a tool to help you locate your state's health department, and many state websites also have listings of all of their local health departments.  3) Find a Walk-in Clinic If your illness is not likely to be very severe or complicated, you may want to try a walk in clinic. These are popping up all over the country in pharmacies, drugstores, and shopping centers. They're usually staffed by nurse practitioners or physician assistants that have been trained to treat common illnesses and complaints. They're usually fairly quick and inexpensive. However, if you have serious medical issues or chronic medical problems, these are probably not your best option.  No Primary Care Doctor: - Call Health Connect at  [phone] - they can help you locate a primary care doctor that  accepts your insurance, provides certain services, etc. - Physician Referral Service- [phone]  Chronic Pain Problems: Organization         Address  Phone   Notes  [facility]  [phone] Patients need to be referred by their primary care doctor.   Medication  Assistance: Organization         Address  Phone   Notes  [facility] Medication [facility] [address] [place], [place] [address] [phone] --Must be a resident of [facility] [place] -- Must have NO insurance coverage whatsoever (no Medicaid/ Medicare, etc.) -- The pt. MUST have a primary care doctor that directs their care regularly and follows them in the community   [facility]ist  [phone]   [employer]  [phone]    Agencies that provide inexpensive medical care: Organization         Address                                                       Phone                                                                            Notes  [facility]  [phone]) [phone]   [facility]    [phone]   [facility] [address] [place], [place] [address] [phone]   [facility] [place] [address], [place] [phone]   [facility]    [phone]) [phone]   [facility]    [phone]   [facility]  [address], [place] Phone:  [phone]) [phone], Fax:  [phone] Hours of Operation:  9 am - 6 pm, M-F.  Also accepts Medicaid/Medicare and self-pay.  [facility]  [address], [place] Phone: [phone],  Fax: [phone]. Hours of Operation:  8:30 am - 5:30 pm, M-F.  Also accepts Medicaid and self-pay.  [facility] [place] [address], [place] Phone: [phone]   [facility] [address], [place], [place] [phone], Ext. [phone] Mondays & Thursdays: 7-9 AM.  First 15 patients are seen on a first come, first serve basis.    Medicaid-accepting [facility] Providers:  Organization         Address                                                                       Phone                               Notes  [facility] [address], [place] [phone]) [phone] Also accepts self-pay patients.  [facility] [address] [place], [place]  [phone]   [facility] [address], [place] [phone]   [facility] [address] [place] [address], [place] [phone]) [phone]   Renaye RakersVeita Bland [address], [place]   [phone] Only accepts [place] Access [place] patients after they have their name applied to their card.   Self-Pay (no insurance) in [facility] [place]:   Organization         Address                                                     Phone               Notes  Sickle Cell Patients, [facility] [address], [place] [phone]   [facility] [address], [place] [phone]   [facility] [place]  [address], [place] [phone]) [phone]   [facility]/Dr. Osei-Bonsu  [address] [place] [address], [place] or [phone], [place] [phone]) [phone] Phone number for both [place] and [place] locations is the same.  [facility] [address], [place] [phone]   [facility] [address] [place] [address], [place] or [address] [phone]) [phone]) [phone]   [facility] [address], [place] [phone], phone; [phone], fax Sees patients 1st and 3rd Saturday of every month.  Must not qualify for public or private insurance (i.e. Medicaid, Medicare, [place] Health Choice, Veterans' Benefits)  Household income should be no more than 200% of the poverty level The clinic cannot treat you if you are pregnant or think you are pregnant  Sexually transmitted diseases are not treated at the clinic.    Dental Care: Organization         Address  Phone                       Notes  [facility] Department of Amb[facility] Center  [facility] [address] [place], [place] [phone] Accepts children up to age 15 who are enrolled in [place] or [place] Health Choice; pregnant women with a Medicaid card; and children who have applied for Medicaid or [place] Health Choice, but were declined, whose parents can pay a reduced fee at time of service.  [facility] Department of [facility]  [address], [place] [phone] Accepts children up to age 39 who are enrolled in [place] or [place] Health Choice; pregnant women with a Medicaid card; and children who have applied for Medicaid or  Health Choice, but were declined, whose parents can pay a reduced fee at time of service.  [facility]  [address] [place], [place] [phone] Patients are seen by appointment only. Walk-ins are not accepted. [facility] will see patients 24 years of age and older. Monday - Tuesday (8am-5pm) Most Wednesdays (8:30-5pm) $30 per visit, cash only  [facility] Adult Dental Access PROGRAM  [address], [facility] [phone] Patients are seen by appointment only. Walk-ins are not accepted. [facility] will see patients 1 years of age and older. One Wednesday Evening (Monthly: Volunteer Based).  $30 per visit, cash only  [employer]  [phone] for adults; Children under age 59, call Graduate Pediatric Dentistry at [phone]. Children aged 96-14, please call [phone] to request a pediatric application.  Dental services are provided in all areas of dental care including fillings, crowns and bridges, complete and partial dentures, implants, gum treatment, root canals, and extractions. Preventive care is also provided. Treatment is provided to both adults and children. Patients are selected via a lottery and there is often a waiting list.   [facility] [address], [place]  [phone] [email or web address]   [facility] [address] [place], [place] [phone], Ext. [phone] Second and Fourth Thursday of each month, opens at 6:30 AM; Clinic ends at 9 AM.  Patients are seen on a first-come first-served basis, and a limited number are seen during each clinic.   [facility]  [address] [place], [place] [phone]   Eligibility Requirements You must have lived in [place], [place], or [place] counties for at least the last three months.   You cannot be eligible for state or federal sponsored [place], including [employer], [place], or [employer].   You generally cannot be eligible for healthcare insurance through your employer.    How to apply: Eligibility screenings are held every Tuesday and Wednesday afternoon from 1:00 pm until 4:00 pm. You do not need an appointment for the interview!  [facility] [address] [place], [place] [phone]   [facility]  [phone]   [facility]  [phone]   [facility]  [phone]

## 2013-11-14 NOTE — ED Provider Notes (Signed)
Medical screening examination/treatment/procedure(s) were performed by non-physician practitioner and as supervising physician I was immediately available for consultation/collaboration.   EKG Interpretation None       Malasia Torain R. Querida Beretta, MD 11/14/13 1556 
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
## 2013-12-04 ENCOUNTER — Encounter (HOSPITAL_COMMUNITY): Payer: Self-pay | Admitting: Emergency Medicine

## 2013-12-04 ENCOUNTER — Emergency Department (HOSPITAL_COMMUNITY)
Admission: EM | Admit: 2013-12-04 | Discharge: 2013-12-04 | Disposition: A | Discharge status: Home / Self Care | Payer: Self-pay | Attending: Emergency Medicine | Admitting: Emergency Medicine

## 2013-12-04 DIAGNOSIS — F17200 Nicotine dependence, unspecified, uncomplicated: Secondary | ICD-10-CM | POA: Insufficient documentation

## 2013-12-04 DIAGNOSIS — R197 Diarrhea, unspecified: Secondary | ICD-10-CM | POA: Insufficient documentation

## 2013-12-04 DIAGNOSIS — R109 Unspecified abdominal pain: Secondary | ICD-10-CM | POA: Insufficient documentation

## 2013-12-04 DIAGNOSIS — R51 Headache: Secondary | ICD-10-CM | POA: Insufficient documentation

## 2013-12-04 MED ORDER — LOPERAMIDE HCL 2 MG PO CAPS
4.0000 mg | ORAL_CAPSULE | Freq: Once | ORAL | Status: AC
Start: 2013-12-04 — End: 2013-12-04
  Administered 2013-12-04: 4 mg via ORAL
  Filled 2013-12-04: qty 2

## 2013-12-04 NOTE — Discharge Instructions (Signed)
Avoid milk until the diarrhea is gone. Take imodium OTC for diarrhea, follow the instructions on the box. Drink plenty of fluids today, like gatorade. Return to the ED if your abdominal pain gets worse, you see blood in your diarrhea, or your diarrhea gets worse.  Diarrhea Diarrhea is frequent loose and watery bowel movements. It can cause you to feel weak and dehydrated. Dehydration can cause you to become tired and thirsty, have a dry mouth, and have decreased urination that often is dark yellow. Diarrhea is a sign of another problem, most often an infection that will not last long. In most cases, diarrhea typically lasts 2 3 days. However, it can last longer if it is a sign of something more serious. It is important to treat your diarrhea as directed by your caregive to lessen or prevent future episodes of diarrhea. CAUSES  Some common causes include:  Gastrointestinal infections caused by viruses, bacteria, or parasites.  Food poisoning or food allergies.  Certain medicines, such as antibiotics, chemotherapy, and laxatives.  Artificial sweeteners and fructose.  Digestive disorders. HOME CARE INSTRUCTIONS  Ensure adequate fluid intake (hydration): have 1 cup (8 oz) of fluid for each diarrhea episode. Avoid fluids that contain simple sugars or sports drinks, fruit juices, whole milk products, and sodas. Your urine should be clear or pale yellow if you are drinking enough fluids. Hydrate with an oral rehydration solution that you can purchase at pharmacies, retail stores, and online. You can prepare an oral rehydration solution at home by mixing the following ingredients together:    tsp table salt.   tsp baking soda.   tsp salt substitute containing potassium chloride.  1  tablespoons sugar.  1 L (34 oz) of water.  Certain foods and beverages may increase the speed at which food moves through the gastrointestinal (GI) tract. These foods and beverages should be avoided and  include:  Caffeinated and alcoholic beverages.  High-fiber foods, such as raw fruits and vegetables, nuts, seeds, and whole grain breads and cereals.  Foods and beverages sweetened with sugar alcohols, such as xylitol, sorbitol, and mannitol.  Some foods may be well tolerated and may help thicken stool including:  Starchy foods, such as rice, toast, pasta, low-sugar cereal, oatmeal, grits, baked potatoes, crackers, and bagels.  Bananas.  Applesauce.  Add probiotic-rich foods to help increase healthy bacteria in the GI tract, such as yogurt and fermented milk products.  Wash your hands well after each diarrhea episode.  Only take over-the-counter or prescription medicines as directed by your caregiver.  Take a warm bath to relieve any burning or pain from frequent diarrhea episodes. SEEK IMMEDIATE MEDICAL CARE IF:   You are unable to keep fluids down.  You have persistent vomiting.  You have blood in your stool, or your stools are black and tarry.  You do not urinate in 6 8 hours, or there is only a small amount of very dark urine.  You have abdominal pain that increases or localizes.  You have weakness, dizziness, confusion, or lightheadedness.  You have a severe headache.  Your diarrhea gets worse or does not get better.  You have a fever or persistent symptoms for more than 2 3 days.  You have a fever and your symptoms suddenly get worse. MAKE SURE YOU:   Understand these instructions.  Will watch your condition.  Will get help right away if you are not doing well or get worse. Document Released: 06/17/2002 Document Revised: 06/13/2012 Document Reviewed: 03/04/2012 [employer] Patient  Information ©2014 [employer]. ° °

## 2013-12-04 NOTE — ED Provider Notes (Signed)
CSN: [id number]     Arrival date & time 12/04/13  1124 History   First MD Initiated Contact with Patient 12/04/13 1149     Chief Complaint  Patient presents with  . Diarrhea     (Consider location/radiation/quality/duration/timing/severity/associated sxs/prior Treatment) HPI Patient reports last night about 10 PM he started having some upper and lower abdominal discomfort that he describes as being achy. He states he ate 2 small chicken wings thinking he was just hungry. He relates at 3 AM he had 3 episodes of loose diarrhea. He states he thought maybe he was constipated however he does not describe hard stools. He does states it hurt when he had the bowel movements. He denies nausea or vomiting. He denies fever. He states he has a slight headache and last night he felt cold and shivering. He denies feeling dizzy or lightheaded. He denies being around anybody else is ill. He denies eating anything he thinks made him ill. Nothing makes him feel better, nothing makes him feel worse.  PCP none  Past Medical History  Diagnosis Date  . Seasonal allergies   . Back pain    No past surgical history on file. No family history on file. History  Substance Use Topics  . Smoking status: Current Every Day Smoker -- 1.00 packs/day    Types: Cigarettes  . Smokeless tobacco: Not on file  . Alcohol Use: Yes     Comment: occasional  employed Quit smoking years ago Employed at [employer]  Review of Systems  All other systems reviewed and are negative.     Allergies  Review of patient's allergies indicates no known allergies.  Home Medications   Prior to Admission medications   Medication Sig Start Date End Date Taking? Authorizing Provider  meclizine (ANTIVERT) 12.5 MG tablet Take 1 tablet (12.5 mg total) by mouth 3 (three) times daily as needed for dizziness. 10/02/13  Yes Robyn M Albert, PA-C   BP 131/74  Pulse 85  Temp(Src) 97.9 F (36.6 C) (Oral)  Resp 18  SpO2  94%  Vital signs normal   Physical Exam  Nursing note and vitals reviewed. Constitutional: He is oriented to person, place, and time. He appears well-developed and well-nourished.  Non-toxic appearance. He does not appear ill. No distress.  HENT:  Head: Normocephalic and atraumatic.  Right Ear: External ear normal.  Left Ear: External ear normal.  Nose: Nose normal. No mucosal edema or rhinorrhea.  Mouth/Throat: Mucous membranes are normal. No dental abscesses or uvula swelling.  Tongue mildly dry  Eyes: Conjunctivae and EOM are normal. Pupils are equal, round, and reactive to light.  Neck: Normal range of motion and full passive range of motion without pain. Neck supple.  Cardiovascular: Normal rate, regular rhythm and normal heart sounds.  Exam reveals no gallop and no friction rub.   No murmur heard. Pulmonary/Chest: Effort normal and breath sounds normal. No respiratory distress. He has no wheezes. He has no rhonchi. He has no rales. He exhibits no tenderness and no crepitus.  Abdominal: Soft. Normal appearance and bowel sounds are normal. He exhibits no distension. There is no tenderness. There is no rebound and no guarding.  Active bowel sounds, has mild tenderness in the upper and lower abdomen without localization, guarding or rebound.  Musculoskeletal: Normal range of motion. He exhibits no edema and no tenderness.  Moves all extremities well.   Neurological: He is alert and oriented to person, place, and time. He has normal strength. No cranial  nerve deficit.  Skin: Skin is warm, dry and intact. No rash noted. No erythema. No pallor.  Psychiatric: He has a normal mood and affect. His speech is normal and behavior is normal. His mood appears not anxious.    ED Course  Procedures (including critical care time)  Medications  loperamide (IMODIUM) capsule 4 mg (not administered)   Pt given option to have IV fluids and labs done, but he does not "like needles". He would prefer  to go home and drink fluids.   Labs Review Labs Reviewed - No data to display  Imaging Review No results found.   EKG Interpretation None      MDM   Final diagnoses:  Abdominal pain  Diarrhea    Plan discharge  Devoria AlbeIva Aodhan Scheidt, MD, Franz DellFACEP     Kamin Niblack L Jevaeh Shams, MD 12/04/13 [phone]

## 2013-12-04 NOTE — ED Notes (Signed)
Pt states he has had 3 episodes of diarrhea since about 0300 this morning.  Pain in upper abd.

## 2013-12-30 ENCOUNTER — Encounter (HOSPITAL_COMMUNITY): Payer: Self-pay | Admitting: Emergency Medicine

## 2013-12-30 ENCOUNTER — Emergency Department (INDEPENDENT_AMBULATORY_CARE_PROVIDER_SITE_OTHER)
Admission: EM | Admit: 2013-12-30 | Discharge: 2013-12-30 | Disposition: A | Discharge status: Home / Self Care | Payer: Self-pay | Source: Home / Self Care | Attending: Family Medicine | Admitting: Family Medicine

## 2013-12-30 DIAGNOSIS — S335XXA Sprain of ligaments of lumbar spine, initial encounter: Secondary | ICD-10-CM

## 2013-12-30 DIAGNOSIS — S39012A Strain of muscle, fascia and tendon of lower back, initial encounter: Secondary | ICD-10-CM

## 2013-12-30 MED ORDER — DICLOFENAC POTASSIUM 50 MG PO TABS: 50.0000 mg | ORAL_TABLET | Freq: Three times a day (TID) | ORAL | Status: DC

## 2013-12-30 NOTE — Discharge Instructions (Signed)
Use ankle brace and medicine as needed, see orthopedist if further problems.

## 2013-12-30 NOTE — ED Provider Notes (Signed)
CSN: [id number]     Arrival date & time 12/30/13  1500 History   First MD Initiated Contact with Patient 12/30/13 1617     Chief Complaint  Patient presents with  . Back Pain   (Consider location/radiation/quality/duration/timing/severity/associated sxs/prior Treatment) Patient is a 23 y.o. male presenting with back pain. The history is provided by the patient.  Back Pain Location:  Lumbar spine Quality:  Stabbing Radiates to:  Does not radiate Pain severity:  Mild Onset quality:  Gradual Duration:  3 days Progression:  Unchanged Chronicity:  Recurrent Context comment:  Diffuse aches and ecchymosis from minor trauma at home and work activity Associated symptoms: leg pain   Associated symptoms: no fever, no pelvic pain and no weakness     Past Medical History  Diagnosis Date  . Seasonal allergies   . Back pain    History reviewed. No pertinent past surgical history. No family history on file. History  Substance Use Topics  . Smoking status: Current Every Day Smoker -- 1.00 packs/day    Types: Cigarettes  . Smokeless tobacco: Not on file  . Alcohol Use: Yes     Comment: occasional    Review of Systems  Constitutional: Negative.  Negative for fever.  Gastrointestinal: Negative.   Genitourinary: Negative.  Negative for pelvic pain.  Musculoskeletal: Positive for back pain. Negative for gait problem, joint swelling and myalgias.  Skin: Negative.   Neurological: Negative for weakness.    Allergies  Review of patient's allergies indicates no known allergies.  Home Medications   Prior to Admission medications   Medication Sig Start Date End Date Taking? Authorizing Provider  diclofenac (CATAFLAM) 50 MG tablet Take 1 tablet (50 mg total) by mouth 3 (three) times daily. For back pain 12/30/13   Linna HoffJames D Tea Collums, MD  meclizine (ANTIVERT) 12.5 MG tablet Take 1 tablet (12.5 mg total) by mouth 3 (three) times daily as needed for dizziness. 10/02/13   Trevor Maceobyn M Albert, PA-C   BP  125/80  Pulse 96  Temp(Src) 98.3 F (36.8 C) (Oral)  Resp 12 Physical Exam  Nursing note and vitals reviewed. Constitutional: He is oriented to person, place, and time. He appears well-developed and well-nourished.  Abdominal: Soft. Bowel sounds are normal.  Musculoskeletal: Normal range of motion. He exhibits no tenderness.  Neurological: He is alert and oriented to person, place, and time.  Nl gait and station, no joint swelling or trauma.  Skin: Skin is warm and dry.    ED Course  Procedures (including critical care time) Labs Review Labs Reviewed - No data to display  Imaging Review No results found.   MDM   1. Strain of lumbar paraspinal muscle, initial encounter        Linna HoffJames D Rasheida Broden, MD 12/30/13 [phone]

## 2013-12-30 NOTE — ED Notes (Signed)
Pt c/o lower back pain, left ankle pain and HA onset 3-4 days Denies recent inj/trauma Attributes pain to work; on feet all day Needing note for work Alert w/no signs of acute disterss.

## 2014-01-25 ENCOUNTER — Encounter (HOSPITAL_COMMUNITY): Payer: Self-pay | Admitting: Emergency Medicine

## 2014-01-25 ENCOUNTER — Emergency Department (HOSPITAL_COMMUNITY)
Admission: EM | Admit: 2014-01-25 | Discharge: 2014-01-25 | Disposition: A | Discharge status: Home / Self Care | Payer: Self-pay | Attending: Emergency Medicine | Admitting: Emergency Medicine

## 2014-01-25 DIAGNOSIS — Z791 Long term (current) use of non-steroidal anti-inflammatories (NSAID): Secondary | ICD-10-CM | POA: Insufficient documentation

## 2014-01-25 DIAGNOSIS — F17200 Nicotine dependence, unspecified, uncomplicated: Secondary | ICD-10-CM | POA: Insufficient documentation

## 2014-01-25 DIAGNOSIS — M545 Low back pain, unspecified: Secondary | ICD-10-CM | POA: Insufficient documentation

## 2014-01-25 DIAGNOSIS — G44209 Tension-type headache, unspecified, not intractable: Secondary | ICD-10-CM | POA: Insufficient documentation

## 2014-01-25 MED ORDER — CYCLOBENZAPRINE HCL 10 MG PO TABS
10.0000 mg | ORAL_TABLET | Freq: Once | ORAL | Status: AC
  Administered 2014-01-25: 10 mg via ORAL
  Filled 2014-01-25: qty 1

## 2014-01-25 MED ORDER — MELOXICAM 15 MG PO TABS: 15.0000 mg | ORAL_TABLET | Freq: Every day | ORAL | Status: DC

## 2014-01-25 MED ORDER — NAPROXEN 500 MG PO TABS
500.0000 mg | ORAL_TABLET | Freq: Once | ORAL | Status: AC
  Administered 2014-01-25: 500 mg via ORAL
  Filled 2014-01-25: qty 1

## 2014-01-25 MED ORDER — CYCLOBENZAPRINE HCL 10 MG PO TABS: 10.0000 mg | ORAL_TABLET | Freq: Two times a day (BID) | ORAL | Status: DC | PRN
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
## 2014-01-25 NOTE — Discharge Instructions (Signed)
Take Mobic as needed for pain. Take Flexeril as needed for muscle spasm. You may take these medications together. Apply heat to your back for pain relief. Refer to attached documents for more information.
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
## 2014-01-25 NOTE — ED Provider Notes (Signed)
Medical screening examination/treatment/procedure(s) were performed by non-physician practitioner and as supervising physician I was immediately available for consultation/collaboration.   EKG Interpretation None        Colden Samaras M Crixus Mcaulay, MD 01/25/14 2347 

## 2014-02-10 ENCOUNTER — Emergency Department (HOSPITAL_COMMUNITY)
Admission: EM | Admit: 2014-02-10 | Discharge: 2014-02-10 | Disposition: A | Discharge status: Home / Self Care | Payer: Self-pay | Attending: Emergency Medicine | Admitting: Emergency Medicine

## 2014-02-10 ENCOUNTER — Encounter (HOSPITAL_COMMUNITY): Payer: Self-pay | Admitting: Emergency Medicine

## 2014-02-10 DIAGNOSIS — F17200 Nicotine dependence, unspecified, uncomplicated: Secondary | ICD-10-CM | POA: Insufficient documentation

## 2014-02-10 DIAGNOSIS — Z79899 Other long term (current) drug therapy: Secondary | ICD-10-CM | POA: Insufficient documentation

## 2014-02-10 DIAGNOSIS — M545 Low back pain, unspecified: Secondary | ICD-10-CM | POA: Insufficient documentation

## 2014-02-10 DIAGNOSIS — R51 Headache: Secondary | ICD-10-CM | POA: Insufficient documentation

## 2014-02-10 DIAGNOSIS — Z8709 Personal history of other diseases of the respiratory system: Secondary | ICD-10-CM | POA: Insufficient documentation

## 2014-02-10 DIAGNOSIS — Z791 Long term (current) use of non-steroidal anti-inflammatories (NSAID): Secondary | ICD-10-CM | POA: Insufficient documentation

## 2014-02-10 DIAGNOSIS — R209 Unspecified disturbances of skin sensation: Secondary | ICD-10-CM | POA: Insufficient documentation

## 2014-02-10 DIAGNOSIS — M549 Dorsalgia, unspecified: Secondary | ICD-10-CM | POA: Insufficient documentation

## 2014-02-10 MED ORDER — METHOCARBAMOL 500 MG PO TABS: 500.0000 mg | ORAL_TABLET | Freq: Two times a day (BID) | ORAL | Status: DC
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
## 2014-02-10 NOTE — Discharge Instructions (Signed)
You may try robaxin instead of flexeril as a muscle relaxer.  Continue mobic (meloxicam) daily for an antiinflammatory.  You may also alternate ice and heat.  Follow up with a primary care provider as needed for continued back pain. See below for further instructions.    Back Exercises These exercises may help you when beginning to rehabilitate your injury. Your symptoms may resolve with or without further involvement from your physician, physical therapist or athletic trainer. While completing these exercises, remember:   Hold stretches for 20-30 seconds, repeat 2-3 times each, perform exercises 2-3 times a day.   Restoring tissue flexibility helps normal motion to return to the joints. This allows healthier, less painful movement and activity.  An effective stretch should be held for at least 30 seconds.  A stretch should never be painful. You should only feel a gentle lengthening or release in the stretched tissue. STRETCH - Extension, Prone on Elbows   Lie on your stomach on the floor, a bed will be too soft. Place your palms about shoulder width apart and at the height of your head.  Place your elbows under your shoulders. If this is too painful, stack pillows under your chest.  Allow your body to relax so that your hips drop lower and make contact more completely with the floor.  Hold this position for __________ seconds.  Slowly return to lying flat on the floor. Repeat __________ times. Complete this exercise __________ times per day.  RANGE OF MOTION - Extension, Prone Press Ups   Lie on your stomach on the floor, a bed will be too soft. Place your palms about shoulder width apart and at the height of your head.  Keeping your back as relaxed as possible, slowly straighten your elbows while keeping your hips on the floor. You may adjust the placement of your hands to maximize your comfort. As you gain motion, your hands will come more underneath your shoulders.  Hold this  position __________ seconds.  Slowly return to lying flat on the floor. Repeat __________ times. Complete this exercise __________ times per day.  RANGE OF MOTION- Quadruped, Neutral Spine   Assume a hands and knees position on a firm surface. Keep your hands under your shoulders and your knees under your hips. You may place padding under your knees for comfort.  Drop your head and point your tail bone toward the ground below you. This will round out your low back like an angry cat. Hold this position for __________ seconds.  Slowly lift your head and release your tail bone so that your back sags into a large arch, like an old horse.  Hold this position for __________ seconds.  Repeat this until you feel limber in your low back.  Now, find your "sweet spot." This will be the most comfortable position somewhere between the two previous positions. This is your neutral spine. Once you have found this position, tense your stomach muscles to support your low back.  Hold this position for __________ seconds. Repeat __________ times. Complete this exercise __________ times per day.  STRETCH - Flexion, Single Knee to Chest   Lie on a firm bed or floor with both legs extended in front of you.  Keeping one leg in contact with the floor, bring your opposite knee to your chest. Hold your leg in place by either grabbing behind your thigh or at your knee.  Pull until you feel a gentle stretch in your low back. Hold __________ seconds.  Slowly  release your grasp and repeat the exercise with the opposite side. Repeat __________ times. Complete this exercise __________ times per day.  STRETCH - Hamstrings, Standing  Stand or sit and extend your right / left leg, placing your foot on a chair or foot stool  Keeping a slight arch in your low back and your hips straight forward.  Lead with your chest and lean forward at the waist until you feel a gentle stretch in the back of your right / left knee or  thigh. (When done correctly, this exercise requires leaning only a small distance.)  Hold this position for __________ seconds. Repeat __________ times. Complete this stretch __________ times per day. STRENGTHENING - Deep Abdominals, Pelvic Tilt   Lie on a firm bed or floor. Keeping your legs in front of you, bend your knees so they are both pointed toward the ceiling and your feet are flat on the floor.  Tense your lower abdominal muscles to press your low back into the floor. This motion will rotate your pelvis so that your tail bone is scooping upwards rather than pointing at your feet or into the floor.  With a gentle tension and even breathing, hold this position for __________ seconds. Repeat __________ times. Complete this exercise __________ times per day.  STRENGTHENING - Abdominals, Crunches   Lie on a firm bed or floor. Keeping your legs in front of you, bend your knees so they are both pointed toward the ceiling and your feet are flat on the floor. Cross your arms over your chest.  Slightly tip your chin down without bending your neck.  Tense your abdominals and slowly lift your trunk high enough to just clear your shoulder blades. Lifting higher can put excessive stress on the low back and does not further strengthen your abdominal muscles.  Control your return to the starting position. Repeat __________ times. Complete this exercise __________ times per day.  STRENGTHENING - Quadruped, Opposite UE/LE Lift   Assume a hands and knees position on a firm surface. Keep your hands under your shoulders and your knees under your hips. You may place padding under your knees for comfort.  Find your neutral spine and gently tense your abdominal muscles so that you can maintain this position. Your shoulders and hips should form a rectangle that is parallel with the floor and is not twisted.  Keeping your trunk steady, lift your right hand no higher than your shoulder and then your left  leg no higher than your hip. Make sure you are not holding your breath. Hold this position __________ seconds.  Continuing to keep your abdominal muscles tense and your back steady, slowly return to your starting position. Repeat with the opposite arm and leg. Repeat __________ times. Complete this exercise __________ times per day. Document Released: 07/15/2005 Document Revised: 09/19/2011 Document Reviewed: 10/09/2008 [facility] Patient Information 2015 [employer], [place]. This information is not intended to replace advice given to you by your health care provider. Make sure you discuss any questions you have with your health care provider.
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
## 2014-02-10 NOTE — ED Provider Notes (Signed)
Medical screening examination/treatment/procedure(s) were performed by non-physician practitioner and as supervising physician I was immediately available for consultation/collaboration.    Vida RollerBrian D Brule, MD 02/10/14 [phone]
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
## 2014-05-07 ENCOUNTER — Encounter (HOSPITAL_COMMUNITY): Payer: Self-pay | Admitting: Emergency Medicine

## 2014-05-07 ENCOUNTER — Emergency Department (HOSPITAL_COMMUNITY)
Admission: EM | Admit: 2014-05-07 | Discharge: 2014-05-07 | Disposition: A | Discharge status: Home / Self Care | Payer: Self-pay | Attending: Emergency Medicine | Admitting: Emergency Medicine

## 2014-05-07 DIAGNOSIS — G8911 Acute pain due to trauma: Secondary | ICD-10-CM | POA: Insufficient documentation

## 2014-05-07 DIAGNOSIS — M545 Low back pain, unspecified: Secondary | ICD-10-CM

## 2014-05-07 DIAGNOSIS — Z79899 Other long term (current) drug therapy: Secondary | ICD-10-CM | POA: Insufficient documentation

## 2014-05-07 DIAGNOSIS — Z720 Tobacco use: Secondary | ICD-10-CM | POA: Insufficient documentation

## 2014-05-07 DIAGNOSIS — Z791 Long term (current) use of non-steroidal anti-inflammatories (NSAID): Secondary | ICD-10-CM | POA: Insufficient documentation

## 2014-05-07 MED ORDER — MELOXICAM 15 MG PO TABS: 15.0000 mg | ORAL_TABLET | Freq: Every day | ORAL | Status: DC

## 2014-05-07 MED ORDER — CYCLOBENZAPRINE HCL 10 MG PO TABS: 10.0000 mg | ORAL_TABLET | Freq: Two times a day (BID) | ORAL | Status: DC | PRN

## 2014-05-07 NOTE — Discharge Instructions (Signed)
Take mobic as needed for pain. Take flexeril as needed for muscle spasm. Refer to attached documents for more information. Return to the ED with worsening or concerning symptoms.  °
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
## 2014-05-07 NOTE — ED Provider Notes (Signed)
Medical screening examination/treatment/procedure(s) were performed by non-physician practitioner and as supervising physician I was immediately available for consultation/collaboration.   EKG Interpretation None       Zyair Rhein, MD 05/07/14 1637 

## 2014-05-07 NOTE — ED Notes (Signed)
Per pt sts that he is having mid and lower back pain. sts also slight HA.

## 2014-05-12 NOTE — Discharge Planning (Signed)
[facility] Eligibility Specialist  Spoke to patient regarding primary care resources and the [facility] orange card. Orange card application and instructions given. [facility], informed him to call the practice to get re-established as a patient. Resource guide and my contact information also given for any future questions or concerns. No other needs identified at this time.

## 2014-05-29 ENCOUNTER — Emergency Department (HOSPITAL_COMMUNITY)
Admission: EM | Admit: 2014-05-29 | Discharge: 2014-05-29 | Disposition: A | Discharge status: Home / Self Care | Payer: Self-pay | Attending: Emergency Medicine | Admitting: Emergency Medicine

## 2014-05-29 ENCOUNTER — Encounter (HOSPITAL_COMMUNITY): Payer: Self-pay | Admitting: Emergency Medicine

## 2014-05-29 DIAGNOSIS — Z720 Tobacco use: Secondary | ICD-10-CM | POA: Insufficient documentation

## 2014-05-29 DIAGNOSIS — J069 Acute upper respiratory infection, unspecified: Secondary | ICD-10-CM | POA: Insufficient documentation

## 2014-05-29 MED ORDER — HYDROCOD POLST-CHLORPHEN POLST 10-8 MG/5ML PO LQCR: 5.0000 mL | Freq: Two times a day (BID) | ORAL | Status: DC | PRN

## 2014-05-29 MED ORDER — IBUPROFEN 800 MG PO TABS: 800.0000 mg | ORAL_TABLET | Freq: Three times a day (TID) | ORAL | Status: DC | PRN

## 2014-05-29 MED ORDER — ALBUTEROL SULFATE HFA 108 (90 BASE) MCG/ACT IN AERS: 1.0000 | INHALATION_SPRAY | Freq: Four times a day (QID) | RESPIRATORY_TRACT | Status: DC | PRN

## 2014-05-29 NOTE — ED Provider Notes (Signed)
CSN: [id number]     Arrival date & time 05/29/14  1316 History   This chart is scribed for non-physician practitioner, Trixie DredgeEmily Madigan Rosensteel, PA-C, working with Derwood KaplanAnkit Nanavati, MD by Abel PrestoKara Demonbreun, ED Scribe.  This patient was seen in room WTR9/WTR9 and the patient's care was started 2:05 PM.     Chief Complaint  Patient presents with  . URI     (Consider location/radiation/quality/duration/timing/severity/associated sxs/prior Treatment) Patient is a 23 y.o. male presenting with URI. The history is provided by the patient. No language interpreter was used.  URI Presenting symptoms: congestion, cough and sore throat   Presenting symptoms: no ear pain   Associated symptoms: headaches and sneezing     HPI Comments: Tanna FurrySamuel D Crook is a 23 y.o. male who presents to the Emergency Department complaining of a cough productive of brown/green sputum with onset 3 days ago. Pt notes associated headache, chills, body aches, mild congestion, and chest soreness from cough.  Pt has taken Tylenol for relief. Pt's daughter is also sick. He notes PMHx of mild asthma and is a smoker. Pt denies bloody sputum, vomiting, dyspnea, and any other medical problems.   Past Medical History  Diagnosis Date  . Seasonal allergies   . Back pain    History reviewed. No pertinent past surgical history. No family history on file. History  Substance Use Topics  . Smoking status: Current Every Day Smoker -- 1.00 packs/day    Types: Cigarettes  . Smokeless tobacco: Not on file  . Alcohol Use: Yes     Comment: occasional    Review of Systems  Constitutional: Positive for chills.  HENT: Positive for congestion, sneezing and sore throat. Negative for ear pain.   Respiratory: Positive for cough. Negative for shortness of breath.   Cardiovascular: Positive for chest pain (soreness from cough).  Gastrointestinal: Negative for vomiting.  Neurological: Positive for headaches.      Allergies  Review of patient's allergies  indicates no known allergies.  Home Medications   Prior to Admission medications   Medication Sig Start Date End Date Taking? Authorizing Provider  cyclobenzaprine (FLEXERIL) 10 MG tablet Take 1 tablet (10 mg total) by mouth 2 (two) times daily as needed for muscle spasms. 05/07/14   Kaitlyn Szekalski, PA-C  diclofenac (CATAFLAM) 50 MG tablet Take 1 tablet (50 mg total) by mouth 3 (three) times daily. For back pain 12/30/13   Linna HoffJames D Kindl, MD  meclizine (ANTIVERT) 12.5 MG tablet Take 1 tablet (12.5 mg total) by mouth 3 (three) times daily as needed for dizziness. 10/02/13   Kathrynn Speedobyn M Hess, PA-C  meloxicam (MOBIC) 15 MG tablet Take 1 tablet (15 mg total) by mouth daily. 01/25/14   Kaitlyn Szekalski, PA-C  meloxicam (MOBIC) 15 MG tablet Take 1 tablet (15 mg total) by mouth daily. 05/07/14   Kaitlyn Szekalski, PA-C  methocarbamol (ROBAXIN) 500 MG tablet Take 1 tablet (500 mg total) by mouth 2 (two) times daily. 02/10/14   Junius FinnerErin O'Malley, PA-C   BP 118/67 mmHg  Pulse 99  Temp(Src) 99.2 F (37.3 C)  Resp 16  SpO2 100% Physical Exam  Constitutional: He appears well-developed and well-nourished. No distress.  HENT:  Head: Normocephalic and atraumatic.  Mouth/Throat: Uvula is midline. Mucous membranes are not dry. Posterior oropharyngeal erythema present. No oropharyngeal exudate, posterior oropharyngeal edema or tonsillar abscesses.   Pharynx erythematous, no edema or exudate,  Nasal turbudence are erythematous.   Eyes: Conjunctivae and EOM are normal. Right eye exhibits no discharge. Left eye  exhibits no discharge.  Neck: Normal range of motion. Neck supple.  Cardiovascular: Normal rate and regular rhythm.   Pulmonary/Chest: Effort normal and breath sounds normal. No stridor. No respiratory distress. He has no wheezes. He has no rales.  Lymphadenopathy:    He has no cervical adenopathy.  Neurological: He is alert.  Skin: He is not diaphoretic.  Nursing note and vitals reviewed.   ED Course   Procedures (including critical care time) DIAGNOSTIC STUDIES: Oxygen Saturation is 100% on room air, normal by my interpretation.    COORDINATION OF CARE: 2:10 PM Discussed treatment plan with patient at beside, the patient agrees with the plan and has no further questions at this time.   Labs Review Labs Reviewed - No data to display  Imaging Review No results found.   EKG Interpretation None      MDM   Final diagnoses:  URI (upper respiratory infection)   Afebrile, nontoxic patient with constellation of symptoms suggestive of viral syndrome.  No concerning findings on exam.  Discharged home with supportive care, PCP follow up.  D/C with albuterol, tussionex, ibuprofen.  Discussed result, findings, treatment, and follow up  with patient.  Pt given return precautions.  Pt verbalizes understanding and agrees with plan.          I personally performed the services described in this documentation, which was scribed in my presence. The recorded information has been reviewed and is accurate.     Trixie Dredgemily Taleeya Blondin, PA-C 05/29/14 1432  Derwood KaplanAnkit Nanavati, MD 05/29/14 2200

## 2014-05-29 NOTE — ED Notes (Signed)
Pt reports cold symptoms, chills and body ache, headache, and nausea for three days. Pt also co of ribcage pain when coughing.

## 2014-05-29 NOTE — Discharge Instructions (Signed)
Read the information below.  Use the prescribed medication as directed.  Please discuss all new medications with your pharmacist.  You may return to the Emergency Department at any time for worsening condition or any new symptoms that concern you.  If you develop high fevers that do not resolve with tylenol or ibuprofen, you have difficulty swallowing or breathing, or you are unable to tolerate fluids by mouth, return to the ER for a recheck.    ° ° °Upper Respiratory Infection, Adult °An upper respiratory infection (URI) is also sometimes known as the common cold. The upper respiratory tract includes the nose, sinuses, throat, trachea, and bronchi. Bronchi are the airways leading to the lungs. Most people improve within 1 week, but symptoms can last up to 2 weeks. A residual cough may last even longer.  °CAUSES °Many different viruses can infect the tissues lining the upper respiratory tract. The tissues become irritated and inflamed and often become very moist. Mucus production is also common. A cold is contagious. You can easily spread the virus to others by oral contact. This includes kissing, sharing a glass, coughing, or sneezing. Touching your mouth or nose and then touching a surface, which is then touched by another person, can also spread the virus. °SYMPTOMS  °Symptoms typically develop 1 to 3 days after you come in contact with a cold virus. Symptoms vary from person to person. They may include: °· Runny nose. °· Sneezing. °· Nasal congestion. °· Sinus irritation. °· Sore throat. °· Loss of voice (laryngitis). °· Cough. °· Fatigue. °· Muscle aches. °· Loss of appetite. °· Headache. °· Low-grade fever. °DIAGNOSIS  °You might diagnose your own cold based on familiar symptoms, since most people get a cold 2 to 3 times a year. Your caregiver can confirm this based on your exam. Most importantly, your caregiver can check that your symptoms are not due to another disease such as strep throat, sinusitis,  pneumonia, asthma, or epiglottitis. Blood tests, throat tests, and X-rays are not necessary to diagnose a common cold, but they may sometimes be helpful in excluding other more serious diseases. Your caregiver will decide if any further tests are required. °RISKS AND COMPLICATIONS  °You may be at risk for a more severe case of the common cold if you smoke cigarettes, have chronic heart disease (such as heart failure) or lung disease (such as asthma), or if you have a weakened immune system. The very young and very old are also at risk for more serious infections. Bacterial sinusitis, middle ear infections, and bacterial pneumonia can complicate the common cold. The common cold can worsen asthma and chronic obstructive pulmonary disease (COPD). Sometimes, these complications can require emergency medical care and may be life-threatening. °PREVENTION  °The best way to protect against getting a cold is to practice good hygiene. Avoid oral or hand contact with people with cold symptoms. Wash your hands often if contact occurs. There is no clear evidence that vitamin C, vitamin E, echinacea, or exercise reduces the chance of developing a cold. However, it is always recommended to get plenty of rest and practice good nutrition. °TREATMENT  °Treatment is directed at relieving symptoms. There is no cure. Antibiotics are not effective, because the infection is caused by a virus, not by bacteria. Treatment may include: °· Increased fluid intake. Sports drinks offer valuable electrolytes, sugars, and fluids. °· Breathing heated mist or steam (vaporizer or shower). °· Eating chicken soup or other clear broths, and maintaining good nutrition. °· Getting plenty   of rest. °· Using gargles or lozenges for comfort. °· Controlling fevers with ibuprofen or acetaminophen as directed by your caregiver. °· Increasing usage of your inhaler if you have asthma. °Zinc gel and zinc lozenges, taken in the first 24 hours of the common cold, can  shorten the duration and lessen the severity of symptoms. Pain medicines may help with fever, muscle aches, and throat pain. A variety of non-prescription medicines are available to treat congestion and runny nose. Your caregiver can make recommendations and may suggest nasal or lung inhalers for other symptoms.  °HOME CARE INSTRUCTIONS  °· Only take over-the-counter or prescription medicines for pain, discomfort, or fever as directed by your caregiver. °· Use a warm mist humidifier or inhale steam from a shower to increase air moisture. This may keep secretions moist and make it easier to breathe. °· Drink enough water and fluids to keep your urine clear or pale yellow. °· Rest as needed. °· Return to work when your temperature has returned to normal or as your caregiver advises. You may need to stay home longer to avoid infecting others. You can also use a face mask and careful hand washing to prevent spread of the virus. °SEEK MEDICAL CARE IF:  °· After the first few days, you feel you are getting worse rather than better. °· You need your caregiver's advice about medicines to control symptoms. °· You develop chills, worsening shortness of breath, or brown or red sputum. These may be signs of pneumonia. °· You develop yellow or brown nasal discharge or pain in the face, especially when you bend forward. These may be signs of sinusitis. °· You develop a fever, swollen neck glands, pain with swallowing, or white areas in the back of your throat. These may be signs of strep throat. °SEEK IMMEDIATE MEDICAL CARE IF:  °· You have a fever. °· You develop severe or persistent headache, ear pain, sinus pain, or chest pain. °· You develop wheezing, a prolonged cough, cough up blood, or have a change in your usual mucus (if you have chronic lung disease). °· You develop sore muscles or a stiff neck. °Document Released: 12/21/2000 Document Revised: 09/19/2011 Document Reviewed: 10/02/2013 °ExitCare® Patient Information ©2015  [employer]. This information is not intended to replace advice given to you by your health care provider. Make sure you discuss any questions you have with your health care provider. ° °Viral Infections °A viral infection can be caused by different types of viruses. Most viral infections are not serious and resolve on their own. However, some infections may cause severe symptoms and may lead to further complications. °SYMPTOMS °Viruses can frequently cause: °· Minor sore throat. °· Aches and pains. °· Headaches. °· Runny nose. °· Different types of rashes. °· Watery eyes. °· Tiredness. °· Cough. °· Loss of appetite. °· Gastrointestinal infections, resulting in nausea, vomiting, and diarrhea. °These symptoms do not respond to antibiotics because the infection is not caused by bacteria. However, you might catch a bacterial infection following the viral infection. This is sometimes called a "superinfection." Symptoms of such a bacterial infection may include: °· Worsening sore throat with pus and difficulty swallowing. °· Swollen neck glands. °· Chills and a high or persistent fever. °· Severe headache. °· Tenderness over the sinuses. °· Persistent overall ill feeling (malaise), muscle aches, and tiredness (fatigue). °· Persistent cough. °· Yellow, green, or brown mucus production with coughing. °HOME CARE INSTRUCTIONS  °· Only take over-the-counter or prescription medicines for pain, discomfort, diarrhea, or fever   as directed by your caregiver.  Drink enough water and fluids to keep your urine clear or pale yellow. Sports drinks can provide valuable electrolytes, sugars, and hydration.  Get plenty of rest and maintain proper nutrition. Soups and broths with crackers or rice are fine. SEEK IMMEDIATE MEDICAL CARE IF:   You have severe headaches, shortness of breath, chest pain, neck pain, or an unusual rash.  You have uncontrolled vomiting, diarrhea, or you are unable to keep down fluids.  You or your  child has an oral temperature above 102 F (38.9 C), not controlled by medicine.  Your baby is older than 3 months with a rectal temperature of 102 F (38.9 C) or higher.  Your baby is 403 months old or younger with a rectal temperature of 100.4 F (38 C) or higher. MAKE SURE YOU:   Understand these instructions.  Will watch your condition.  Will get help right away if you are not doing well or get worse. Document Released: 04/06/2005 Document Revised: 09/19/2011 Document Reviewed: 11/01/2010 [facility] Patient Information 2015 [place]ExitCare, [place]. This information is not intended to replace advice given to you by your health care provider. Make sure you discuss any questions you have with your health care provider.

## 2014-07-09 ENCOUNTER — Emergency Department (HOSPITAL_COMMUNITY)
Admission: EM | Admit: 2014-07-09 | Discharge: 2014-07-09 | Disposition: A | Discharge status: Home / Self Care | Payer: Self-pay | Attending: Emergency Medicine | Admitting: Emergency Medicine

## 2014-07-09 ENCOUNTER — Emergency Department (HOSPITAL_COMMUNITY): Payer: Self-pay

## 2014-07-09 ENCOUNTER — Encounter (HOSPITAL_COMMUNITY): Payer: Self-pay | Admitting: Nurse Practitioner

## 2014-07-09 DIAGNOSIS — R059 Cough, unspecified: Secondary | ICD-10-CM

## 2014-07-09 DIAGNOSIS — M545 Low back pain, unspecified: Secondary | ICD-10-CM

## 2014-07-09 DIAGNOSIS — Z720 Tobacco use: Secondary | ICD-10-CM | POA: Insufficient documentation

## 2014-07-09 DIAGNOSIS — Z79899 Other long term (current) drug therapy: Secondary | ICD-10-CM | POA: Insufficient documentation

## 2014-07-09 DIAGNOSIS — R05 Cough: Secondary | ICD-10-CM | POA: Insufficient documentation

## 2014-07-09 DIAGNOSIS — Z791 Long term (current) use of non-steroidal anti-inflammatories (NSAID): Secondary | ICD-10-CM | POA: Insufficient documentation

## 2014-07-09 DIAGNOSIS — G8929 Other chronic pain: Secondary | ICD-10-CM | POA: Insufficient documentation

## 2014-07-09 LAB — URINALYSIS, ROUTINE W REFLEX MICROSCOPIC
Bilirubin Urine: NEGATIVE
GLUCOSE, UA: NEGATIVE mg/dL
Hgb urine dipstick: NEGATIVE
KETONES UR: NEGATIVE mg/dL
LEUKOCYTES UA: NEGATIVE
Nitrite: NEGATIVE
PH: 6 (ref 5.0–8.0)
Protein, ur: NEGATIVE mg/dL
Specific Gravity, Urine: 1.028 (ref 1.005–1.030)
Urobilinogen, UA: 0.2 mg/dL (ref 0.0–1.0)

## 2014-07-09 MED ORDER — HYDROCODONE-ACETAMINOPHEN 5-325 MG PO TABS
1.0000 | ORAL_TABLET | Freq: Once | ORAL | Status: AC
  Administered 2014-07-09: 1 via ORAL
  Filled 2014-07-09: qty 1

## 2014-07-09 MED ORDER — IBUPROFEN 800 MG PO TABS: 800.0000 mg | ORAL_TABLET | Freq: Three times a day (TID) | ORAL | Status: DC | PRN

## 2014-07-09 MED ORDER — CYCLOBENZAPRINE HCL 10 MG PO TABS: 10.0000 mg | ORAL_TABLET | Freq: Two times a day (BID) | ORAL | Status: DC | PRN
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
## 2014-07-09 NOTE — Discharge Instructions (Signed)

## 2014-07-09 NOTE — ED Notes (Signed)
Patient states this pain is different.  He denies any difficulty voiding.  He denies any new trauma.  Patient states this pain is different from his chronic back.  Patient also reports he has had a cough for 2 weeks.  He has tried otc meds w/o relief.  Patient took ibuprofen today for pain.  Patient states the meds he was given before seemed to help but he is out of medications

## 2014-10-06 ENCOUNTER — Emergency Department (HOSPITAL_COMMUNITY)
Admission: EM | Admit: 2014-10-06 | Discharge: 2014-10-06 | Disposition: A | Discharge status: Home / Self Care | Payer: Self-pay | Attending: Emergency Medicine | Admitting: Emergency Medicine

## 2014-10-06 ENCOUNTER — Emergency Department (HOSPITAL_COMMUNITY): Payer: Self-pay

## 2014-10-06 ENCOUNTER — Encounter (HOSPITAL_COMMUNITY): Payer: Self-pay | Admitting: *Deleted

## 2014-10-06 DIAGNOSIS — S8392XA Sprain of unspecified site of left knee, initial encounter: Secondary | ICD-10-CM | POA: Insufficient documentation

## 2014-10-06 DIAGNOSIS — Y929 Unspecified place or not applicable: Secondary | ICD-10-CM | POA: Insufficient documentation

## 2014-10-06 DIAGNOSIS — Z720 Tobacco use: Secondary | ICD-10-CM | POA: Insufficient documentation

## 2014-10-06 DIAGNOSIS — Z79899 Other long term (current) drug therapy: Secondary | ICD-10-CM | POA: Insufficient documentation

## 2014-10-06 DIAGNOSIS — Y998 Other external cause status: Secondary | ICD-10-CM | POA: Insufficient documentation

## 2014-10-06 DIAGNOSIS — X58XXXA Exposure to other specified factors, initial encounter: Secondary | ICD-10-CM | POA: Insufficient documentation

## 2014-10-06 DIAGNOSIS — Y9389 Activity, other specified: Secondary | ICD-10-CM | POA: Insufficient documentation

## 2014-10-06 DIAGNOSIS — M25562 Pain in left knee: Secondary | ICD-10-CM

## 2014-10-06 MED ORDER — NAPROXEN 500 MG PO TABS: 500.0000 mg | ORAL_TABLET | Freq: Two times a day (BID) | ORAL | Status: DC

## 2014-10-06 MED ORDER — OXYCODONE-ACETAMINOPHEN 5-325 MG PO TABS
1.0000 | ORAL_TABLET | Freq: Once | ORAL | Status: AC
  Administered 2014-10-06: 1 via ORAL
  Filled 2014-10-06: qty 1

## 2014-10-06 MED ORDER — HYDROCODONE-ACETAMINOPHEN 5-325 MG PO TABS: 1.0000 | ORAL_TABLET | Freq: Four times a day (QID) | ORAL | Status: DC | PRN
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
## 2014-10-06 NOTE — Discharge Instructions (Signed)
Crutch Use  Crutches are used to take weight off one of your legs or feet when you stand or walk. It is important to use crutches that fit properly. When fitted properly:  · Each crutch should be 2-3 finger widths below the armpit.  · Your weight should be supported by your hand, and not by resting the armpit on the crutch.    RISKS AND COMPLICATIONS  Damage to the nerves that extend from your armpit to your hand and arm. To prevent this from happening, make sure your crutches fit properly and do not put pressure on your armpit when using them.  HOW TO USE YOUR CRUTCHES  If you have been instructed to use partial weight bearing, apply (bear) the amount of weight as your health care provider suggests. Do not bear weight in an amount that causes pain to the area of injury.  Walking  1. Step with the crutches.  2. Swing the healthy leg slightly ahead of the crutches.  Going Up Steps  If there is no handrail:  1. Step up with the healthy leg.  2. Step up with the crutches and injured leg.  3. Continue in this way.  If there is a handrail:  1. Hold both crutches in one hand.  2. Place your free hand on the handrail.  3. While putting your weight on your arms, lift your healthy leg to the step.  4. Bring the crutches and the injured leg up to that step.  5. Continue in this way.  Going Down Steps  Be very careful, as going down stairs with crutches is very challenging. If there is no handrail:  1. Step down with the injured leg and crutches.  2. Step down with the healthy leg.  If there is a handrail:  1. Place your hand on the handrail.  2. Hold both crutches with your free hand.  3. Lower your injured leg and crutch to the step below you. Make sure to keep the crutch tips in the center of the step, never on the edge.  4. Lower your healthy leg to that step.  5. Continue in this way.  Standing Up  1. Hold the injured leg forward.  2. Grab the armrest with one hand and the top of the crutches with the other  hand.  3. Using these supports, pull yourself up to a standing position.  Sitting Down  1. Hold the injured leg forward.  2. Grab the armrest with one hand and the top of the crutches with the other hand.  3. Lower yourself to a sitting position.  SEEK MEDICAL CARE IF:  · You still feel unsteady on your feet.  · You develop new pain, for example in your armpits, back, shoulder, wrist, or hip.  · You develop any numbness or tingling.  SEEK IMMEDIATE MEDICAL CARE IF:  You fall.  Document Released: 06/24/2000 Document Revised: 07/02/2013 Document Reviewed: 03/04/2013  ExitCare® Patient Information ©2015 [employer]. This information is not intended to replace advice given to you by your health care provider. Make sure you discuss any questions you have with your health care provider.  Knee Sprain  A knee sprain is a tear in one of the strong, fibrous tissues that connect the bones (ligaments) in your knee. The severity of the sprain depends on how much of the ligament is torn. The tear can be either partial or complete.  CAUSES   Often, sprains are a result of a fall or   injury. The force of the impact causes the fibers of your ligament to stretch too much. This excess tension causes the fibers of your ligament to tear.  SIGNS AND SYMPTOMS   You may have some loss of motion in your knee. Other symptoms include:  · Bruising.  · Pain in the knee area.  · Tenderness of the knee to the touch.  · Swelling.  DIAGNOSIS   To diagnose a knee sprain, your health care provider will physically examine your knee. Your health care provider may also suggest an X-ray exam of your knee to make sure no bones are broken.  TREATMENT   If your ligament is only partially torn, treatment usually involves keeping the knee in a fixed position (immobilization) or bracing your knee for activities that require movement for several weeks. To do this, your health care provider will apply a bandage, cast, or splint to keep your knee from moving and  to support your knee during movement until it heals. For a partially torn ligament, the healing process usually takes 4-6 weeks.  If your ligament is completely torn, depending on which ligament it is, you may need surgery to reconnect the ligament to the bone or reconstruct it. After surgery, a cast or splint may be applied and will need to stay on your knee for 4-6 weeks while your ligament heals.  HOME CARE INSTRUCTIONS  3. Keep your injured knee elevated to decrease swelling.  4. To ease pain and swelling, apply ice to the injured area:  1. Put ice in a plastic bag.  2. Place a towel between your skin and the bag.  3. Leave the ice on for 20 minutes, 2-3 times a day.  5. Only take medicine for pain as directed by your health care provider.  6. Do not leave your knee unprotected until pain and stiffness go away (usually 4-6 weeks).  7. If you have a cast or splint, do not allow it to get wet. If you have been instructed not to remove it, cover it with a plastic bag when you shower or bathe. Do not swim.  8. Your health care provider may suggest exercises for you to do during your recovery to prevent or limit permanent weakness and stiffness.  SEEK IMMEDIATE MEDICAL CARE IF:  4. Your cast or splint becomes damaged.  5. Your pain becomes worse.  6. You have significant pain, swelling, or numbness below the cast or splint.  MAKE SURE YOU:  6. Understand these instructions.  7. Will watch your condition.  8. Will get help right away if you are not doing well or get worse.  Document Released: 06/27/2005 Document Revised: 04/17/2013 Document Reviewed: 02/06/2013  ExitCare® Patient Information ©2015 [employer]. This information is not intended to replace advice given to you by your health care provider. Make sure you discuss any questions you have with your health care provider.
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
## 2014-10-07 ENCOUNTER — Telehealth: Payer: Self-pay | Admitting: *Deleted

## 2014-10-07 NOTE — Telephone Encounter (Signed)
Pt called r/t prescriptions not called in to his pharmacy.  NCM informed him that prescriptions were printed and in his packet.  Pt located packet and found prescriptions; wondered where other prescriptions were because he only had two.  NCM informed him that was all medicines prescribed were printed; if he needed refills on other medications; he needed to see his PCP. Pt verbalized understanding.

## 2015-01-09 IMAGING — DX DG CHEST 2V
2 series · 2 of 2 positions shown · non-contrast
Comparison: 01/28/2008.

CLINICAL DATA: LEFT posterior back pain.  Dry cough.

EXAM:
CHEST  2 VIEW

[chest pa]
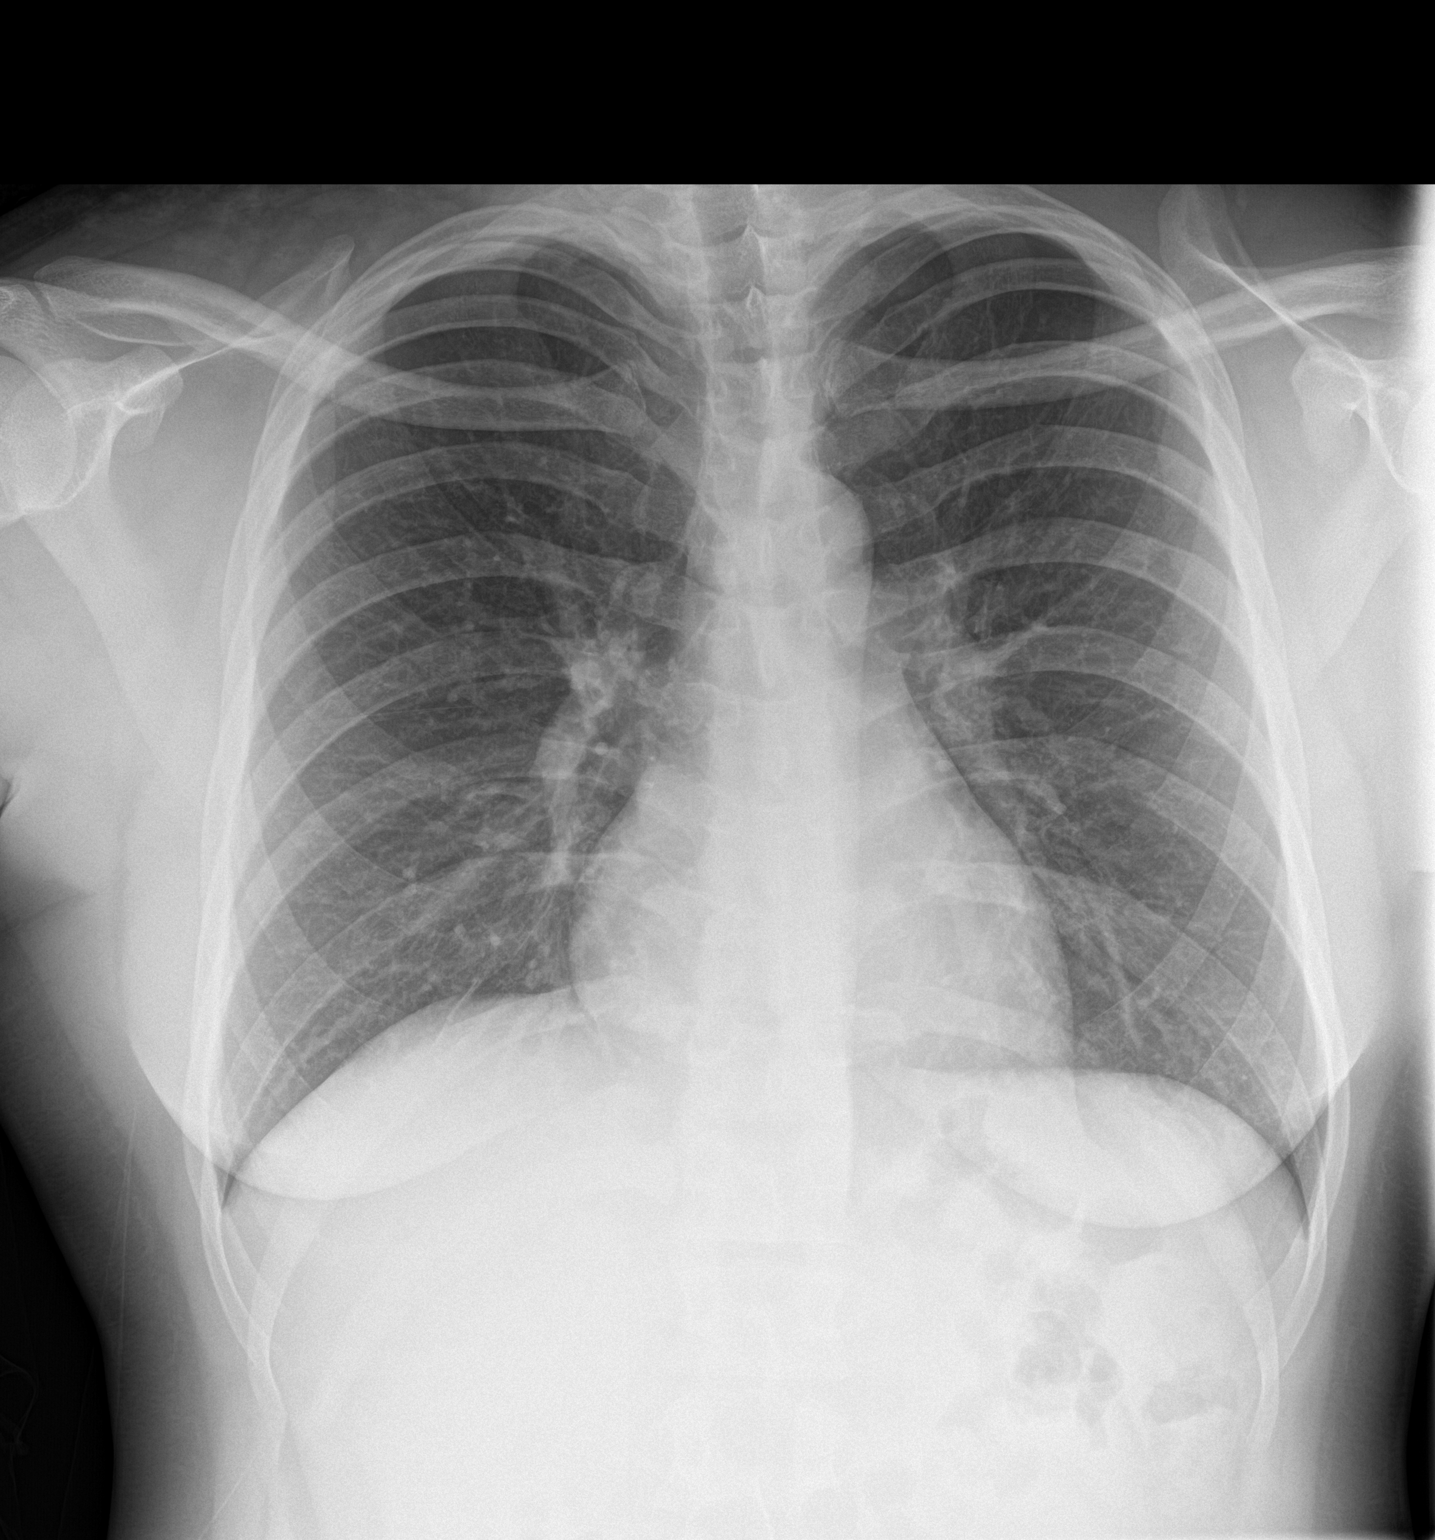

[chest lat]
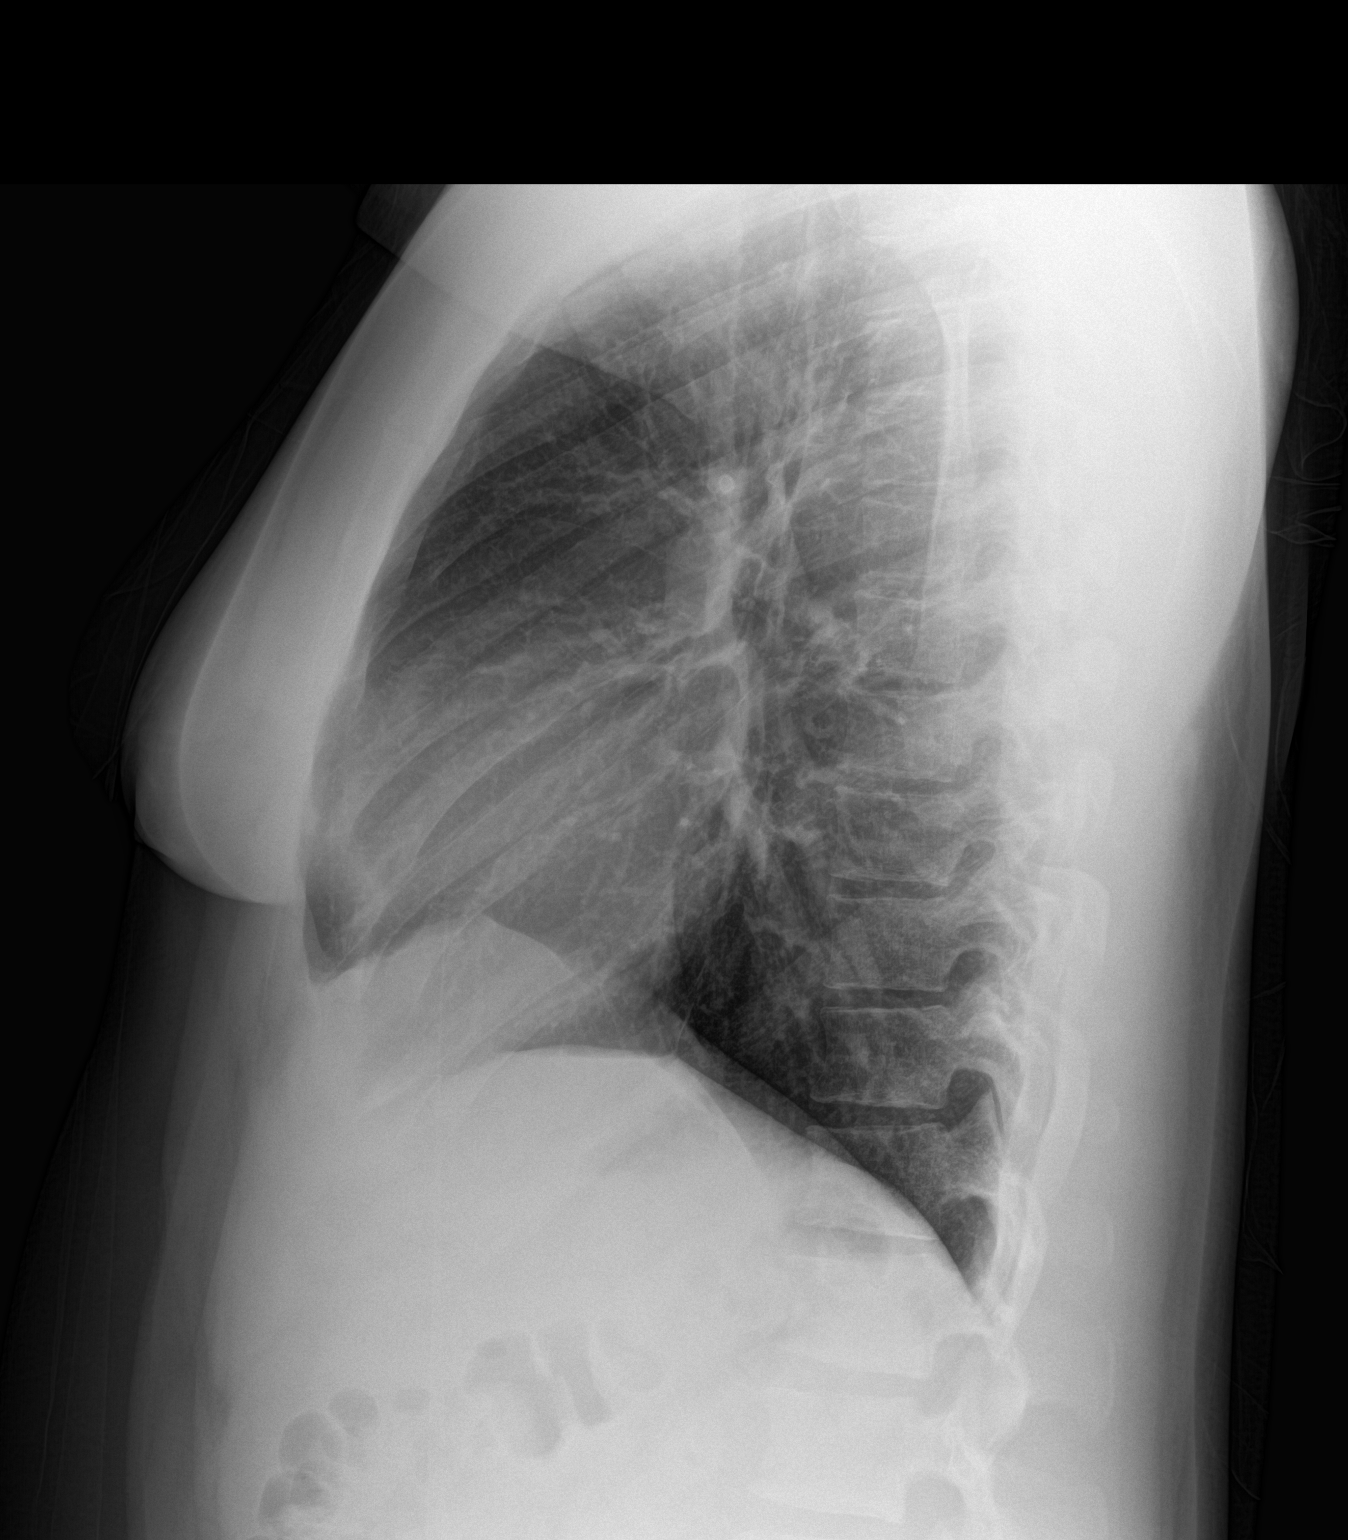

[2 of 2 positions shown; findings below may reference images not displayed]

FINDINGS: The heart size and mediastinal contours are within normal limits.
Both lungs are clear. The visualized skeletal structures are
unremarkable.
IMPRESSION: No active cardiopulmonary disease.

## 2015-04-28 ENCOUNTER — Encounter (HOSPITAL_COMMUNITY): Payer: Self-pay | Admitting: Emergency Medicine

## 2015-04-28 ENCOUNTER — Emergency Department (HOSPITAL_COMMUNITY)
Admission: EM | Admit: 2015-04-28 | Discharge: 2015-04-28 | Disposition: A | Discharge status: Home / Self Care | Payer: Self-pay | Attending: Emergency Medicine | Admitting: Emergency Medicine

## 2015-04-28 DIAGNOSIS — G8929 Other chronic pain: Secondary | ICD-10-CM | POA: Insufficient documentation

## 2015-04-28 DIAGNOSIS — M542 Cervicalgia: Secondary | ICD-10-CM | POA: Insufficient documentation

## 2015-04-28 DIAGNOSIS — Z79899 Other long term (current) drug therapy: Secondary | ICD-10-CM | POA: Insufficient documentation

## 2015-04-28 DIAGNOSIS — M549 Dorsalgia, unspecified: Secondary | ICD-10-CM

## 2015-04-28 DIAGNOSIS — M545 Low back pain: Secondary | ICD-10-CM | POA: Insufficient documentation

## 2015-04-28 DIAGNOSIS — Z87891 Personal history of nicotine dependence: Secondary | ICD-10-CM | POA: Insufficient documentation

## 2015-04-28 DIAGNOSIS — M25562 Pain in left knee: Secondary | ICD-10-CM | POA: Insufficient documentation
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
## 2015-04-28 NOTE — ED Notes (Signed)
Pt reports waking up with pain in r/side of neck two days ago. C/o low and mid back pain. Denies recent trauma. Treated with Motrin x 3, no decrease in pain after Motrin

## 2015-04-28 NOTE — Discharge Instructions (Signed)
Back Exercises The following exercises strengthen the muscles that help to support the back. They also help to keep the lower back flexible. Doing these exercises can help to prevent back pain or lessen existing pain. If you have back pain or discomfort, try doing these exercises 2-3 times each day or as told by your health care provider. When the pain goes away, do them once each day, but increase the number of times that you repeat the steps for each exercise (do more repetitions). If you do not have back pain or discomfort, do these exercises once each day or as told by your health care provider. EXERCISES Single Knee to Chest Repeat these steps 3-5 times for each leg:  Lie on your back on a firm bed or the floor with your legs extended.  Bring one knee to your chest. Your other leg should stay extended and in contact with the floor.  Hold your knee in place by grabbing your knee or thigh.  Pull on your knee until you feel a gentle stretch in your lower back.  Hold the stretch for 10-30 seconds.  Slowly release and straighten your leg. Pelvic Tilt Repeat these steps 5-10 times:  Lie on your back on a firm bed or the floor with your legs extended.  Bend your knees so they are pointing toward the ceiling and your feet are flat on the floor.  Tighten your lower abdominal muscles to press your lower back against the floor. This motion will tilt your pelvis so your tailbone points up toward the ceiling instead of pointing to your feet or the floor.  With gentle tension and even breathing, hold this position for 5-10 seconds. Cat-Cow Repeat these steps until your lower back becomes more flexible:  Get into a hands-and-knees position on a firm surface. Keep your hands under your shoulders, and keep your knees under your hips. You may place padding under your knees for comfort.  Let your head hang down, and point your tailbone toward the floor so your lower back becomes rounded like the  back of a cat.  Hold this position for 5 seconds.  Slowly lift your head and point your tailbone up toward the ceiling so your back forms a sagging arch like the back of a cow.  Hold this position for 5 seconds. Press-Ups Repeat these steps 5-10 times:  Lie on your abdomen (face-down) on the floor.  Place your palms near your head, about shoulder-width apart.  While you keep your back as relaxed as possible and keep your hips on the floor, slowly straighten your arms to raise the top half of your body and lift your shoulders. Do not use your back muscles to raise your upper torso. You may adjust the placement of your hands to make yourself more comfortable.  Hold this position for 5 seconds while you keep your back relaxed.  Slowly return to lying flat on the floor. Bridges Repeat these steps 10 times:  Lie on your back on a firm surface.  Bend your knees so they are pointing toward the ceiling and your feet are flat on the floor.  Tighten your buttocks muscles and lift your buttocks off of the floor until your waist is at almost the same height as your knees. You should feel the muscles working in your buttocks and the back of your thighs. If you do not feel these muscles, slide your feet 1-2 inches farther away from your buttocks.  Hold this position for 3-5  seconds.  Slowly lower your hips to the starting position, and allow your buttocks muscles to relax completely. If this exercise is too easy, try doing it with your arms crossed over your chest. Abdominal Crunches Repeat these steps 5-10 times:  Lie on your back on a firm bed or the floor with your legs extended.  Bend your knees so they are pointing toward the ceiling and your feet are flat on the floor.  Cross your arms over your chest.  Tip your chin slightly toward your chest without bending your neck.  Tighten your abdominal muscles and slowly raise your trunk (torso) high enough to lift your shoulder blades a  tiny bit off of the floor. Avoid raising your torso higher than that, because it can put too much stress on your low back and it does not help to strengthen your abdominal muscles.  Slowly return to your starting position. Back Lifts Repeat these steps 5-10 times: 1. Lie on your abdomen (face-down) with your arms at your sides, and rest your forehead on the floor. 2. Tighten the muscles in your legs and your buttocks. 3. Slowly lift your chest off of the floor while you keep your hips pressed to the floor. Keep the back of your head in line with the curve in your back. Your eyes should be looking at the floor. 4. Hold this position for 3-5 seconds. 5. Slowly return to your starting position. SEEK MEDICAL CARE IF:  Your back pain or discomfort gets much worse when you do an exercise.  Your back pain or discomfort does not lessen within 2 hours after you exercise. If you have any of these problems, stop doing these exercises right away. Do not do them again unless your health care provider says that you can. SEEK IMMEDIATE MEDICAL CARE IF:  You develop sudden, severe back pain. If this happens, stop doing the exercises right away. Do not do them again unless your health care provider says that you can.   This information is not intended to replace advice given to you by your health care provider. Make sure you discuss any questions you have with your health care provider.   Document Released: 08/04/2004 Document Revised: 03/18/2015 Document Reviewed: 08/21/2014 Elsevier Interactive Patient Education 2016 [employer] Back Injury Prevention Back injuries can be very painful. They can also be difficult to heal. After having one back injury, you are more likely to injure your back again. It is important to learn how to avoid injuring or re-injuring your back. The following tips can help you to prevent a back injury. WHAT SHOULD I KNOW ABOUT PHYSICAL FITNESS?  Exercise for 30 minutes per day  on most days of the week or as directed by your health care provider. Make sure to:  Do aerobic exercises, such as walking, jogging, biking, or swimming.  Do exercises that increase balance and strength, such as tai chi and yoga. These can decrease your risk of falling and injuring your back.  Do stretching exercises to help with flexibility.  Try to develop strong abdominal muscles. Your abdominal muscles provide a lot of the support that is needed by your back.  Maintain a healthy weight. This helps to decrease your risk of a back injury. WHAT SHOULD I KNOW ABOUT MY DIET?  Talk with your health care provider about your overall diet. Take supplements and vitamins only as directed by your health care provider.  Talk with your health care provider about how much calcium and vitamin   D you need each day. These nutrients help to prevent weakening of the bones (osteoporosis). Osteoporosis can cause broken (fractured) bones, which lead to back pain.  Include good sources of calcium in your diet, such as dairy products, green leafy vegetables, and products that have had calcium added to them (fortified).  Include good sources of vitamin D in your diet, such as milk and foods that are fortified with vitamin D. WHAT SHOULD I KNOW ABOUT MY POSTURE?  Sit up straight and stand up straight. Avoid leaning forward when you sit or hunching over when you stand.  Choose chairs that have good low-back (lumbar) support.  If you work at a desk, sit close to it so you do not need to lean over. Keep your chin tucked in. Keep your neck drawn back, and keep your elbows bent at a right angle. Your arms should look like the letter "L."  Sit high and close to the steering wheel when you drive. Add a lumbar support to your car seat, if needed.  Avoid sitting or standing in one position for very long. Take breaks to get up, stretch, and walk around at least one time every hour. Take breaks every hour if you are  driving for long periods of time.  Sleep on your side with your knees slightly bent, or sleep on your back with a pillow under your knees. Do not lie on the front of your body to sleep. WHAT SHOULD I KNOW ABOUT LIFTING, TWISTING, AND REACHING? Lifting and Heavy Lifting  Avoid heavy lifting, especially repetitive heavy lifting. If you must do heavy lifting:  Stretch before lifting.  Work slowly.  Rest between lifts.  Use a tool such as a cart or a dolly to move objects if one is available.  Make several small trips instead of carrying one heavy load.  Ask for help when you need it, especially when moving big objects.  Follow these steps when lifting:  Stand with your feet shoulder-width apart.  Get as close to the object as you can. Do not try to pick up a heavy object that is far from your body.  Use handles or lifting straps if they are available.  Bend at your knees. Squat down, but keep your heels off the floor.  Keep your shoulders pulled back, your chin tucked in, and your back straight.  Lift the object slowly while you tighten the muscles in your legs, abdomen, and buttocks. Keep the object as close to the center of your body as possible.  Follow these steps when putting down a heavy load:  Stand with your feet shoulder-width apart.  Lower the object slowly while you tighten the muscles in your legs, abdomen, and buttocks. Keep the object as close to the center of your body as possible.  Keep your shoulders pulled back, your chin tucked in, and your back straight.  Bend at your knees. Squat down, but keep your heels off the floor.  Use handles or lifting straps if they are available. Twisting and Reaching  Avoid lifting heavy objects above your waist.  Do not twist at your waist while you are lifting or carrying a load. If you need to turn, move your feet.  Do not bend over without bending at your knees.  Avoid reaching over your head, across a table, or  for an object on a high surface. WHAT ARE SOME OTHER TIPS?  Avoid wet floors and icy ground. Keep sidewalks clear of ice to prevent falls.    falls.  Do not sleep on a mattress that is too soft or too hard.  Keep items that are used frequently within easy reach.  Put heavier objects on shelves at waist level, and put lighter objects on lower or higher shelves.  Find ways to decrease your stress, such as exercise, massage, or relaxation techniques. Stress can build up in your muscles. Tense muscles are more vulnerable to injury.  Talk with your health care provider if you feel anxious or depressed. These conditions can make back pain worse.  Wear flat heel shoes with cushioned soles.  Avoid sudden movements.  Use both shoulder straps when carrying a backpack.  Do not use any tobacco products, including cigarettes, chewing tobacco, or electronic cigarettes. If you need help quitting, ask your health care provider.   This information is not intended to replace advice given to you by your health care provider. Make sure you discuss any questions you have with your health care provider.   Document Released: 08/04/2004 Document Revised: 11/11/2014 Document Reviewed: 07/01/2014 Elsevier Interactive Patient Education [employer].

## 2016-07-17 DIAGNOSIS — Z87891 Personal history of nicotine dependence: Secondary | ICD-10-CM | POA: Insufficient documentation

## 2016-07-17 DIAGNOSIS — Z79899 Other long term (current) drug therapy: Secondary | ICD-10-CM | POA: Insufficient documentation

## 2016-07-17 DIAGNOSIS — J111 Influenza due to unidentified influenza virus with other respiratory manifestations: Secondary | ICD-10-CM | POA: Insufficient documentation

## 2016-07-18 ENCOUNTER — Emergency Department (HOSPITAL_COMMUNITY)
Admission: EM | Admit: 2016-07-18 | Discharge: 2016-07-18 | Disposition: A | Discharge status: Home / Self Care | Payer: Self-pay | Attending: Emergency Medicine | Admitting: Emergency Medicine

## 2016-07-18 ENCOUNTER — Encounter (HOSPITAL_COMMUNITY): Payer: Self-pay | Admitting: Oncology

## 2016-07-18 DIAGNOSIS — J111 Influenza due to unidentified influenza virus with other respiratory manifestations: Secondary | ICD-10-CM

## 2016-07-18 MED ORDER — DIPHENOXYLATE-ATROPINE 2.5-0.025 MG PO TABS
2.0000 | ORAL_TABLET | Freq: Once | ORAL | Status: AC
  Administered 2016-07-18: 2 via ORAL
  Filled 2016-07-18: qty 2

## 2016-07-18 MED ORDER — IBUPROFEN 800 MG PO TABS: 800.0000 mg | ORAL_TABLET | Freq: Three times a day (TID) | ORAL | 0 refills | Status: DC

## 2016-07-18 MED ORDER — HYDROCODONE-HOMATROPINE 5-1.5 MG/5ML PO SYRP: 5.0000 mL | ORAL_SOLUTION | Freq: Four times a day (QID) | ORAL | 0 refills | Status: DC | PRN

## 2016-07-18 NOTE — ED Notes (Signed)
Bed: WA08 Expected date:  Expected time:  Means of arrival:  Comments: 

## 2016-07-18 NOTE — ED Triage Notes (Signed)
Pt c/o chills, sore throat, generalized body aches, cough, congestion and malaise since Friday.  Pt has tried OTC medication w/o sx relief.  Rates pain 7/10.

## 2016-07-18 NOTE — ED Provider Notes (Signed)
[facility]-EMERGENCY DEPT Provider Note   CSN: [id number] Arrival date & time: 07/17/16  2351   By signing my name below, I, Clarisse GougeXavier Herndon, attest that this documentation has been prepared under the direction and in the presence of Gilda Creasehristopher J Pollina, MD. Electronically signed, Clarisse GougeXavier Herndon, ED Scribe. 07/18/16. 1:41 AM.   History   Chief Complaint Chief Complaint  Patient presents with  . Flu Like Sx   The history is provided by the patient and medical records. No language interpreter was used.    HPI Comments: Brent Blake is a 26 y.o. male who presents to the Emergency Department complaining of persistent, gradually worsening cough x 4-5 days. Pt notes associated headache, rhinorrhea, watery eyes, body aches, weakness, cough, sore throat, dehydration, diarrhea, decreased appetite and abdominal pain. Denies N/V.  Past Medical History:  Diagnosis Date  . Back pain   . Seasonal allergies     Patient Active Problem List   Diagnosis Date Noted  . TOBACCO USER 04/18/2009  . RHINITIS, ALLERGIC 09/07/2006    Past Surgical History:  Procedure Laterality Date  . DENTAL SURGERY         Home Medications    Prior to Admission medications   Medication Sig Start Date End Date Taking? Authorizing Provider  HYDROcodone-homatropine (HYCODAN) 5-1.5 MG/5ML syrup Take 5 mLs by mouth every 6 (six) hours as needed for cough. 07/18/16   Gilda Creasehristopher J Pollina, MD  ibuprofen (ADVIL,MOTRIN) 800 MG tablet Take 1 tablet (800 mg total) by mouth 3 (three) times daily. 07/18/16   Gilda Creasehristopher J Pollina, MD    Family History No family history on file.  Social History Social History  Substance Use Topics  . Smoking status: Former Smoker    Packs/day: 0.00    Quit date: 12/13/2014  . Smokeless tobacco: Never Used  . Alcohol use Yes     Comment: occasional     Allergies   Patient has no known allergies.   Review of Systems Review of Systems  All other systems reviewed and are  negative.  A complete 10 system review of systems was obtained and all systems are negative except as noted in the HPI and PMH.    Physical Exam Updated Vital Signs BP 137/90 (BP Location: Right Arm)   Pulse 96   Temp 98.4 F (36.9 C) (Oral)   Resp 20   Ht 6\' 1"  (1.854 m)   Wt 250 lb (113.4 kg)   SpO2 98%   BMI 32.98 kg/m   Physical Exam  Constitutional: He is oriented to person, place, and time. He appears well-developed and well-nourished. No distress.  HENT:  Head: Normocephalic and atraumatic.  Right Ear: Hearing normal.  Left Ear: Hearing normal.  Nose: Nose normal.  Mouth/Throat: Oropharynx is clear and moist and mucous membranes are normal.  Eyes: Conjunctivae and EOM are normal. Pupils are equal, round, and reactive to light.  Neck: Normal range of motion. Neck supple.  Cardiovascular: Regular rhythm, S1 normal and S2 normal.  Exam reveals no gallop and no friction rub.   No murmur heard. Pulmonary/Chest: Effort normal and breath sounds normal. No respiratory distress. He exhibits no tenderness.  Abdominal: Soft. Normal appearance and bowel sounds are normal. There is no hepatosplenomegaly. There is no tenderness. There is no rebound, no guarding, no tenderness at McBurney's point and negative Murphy's sign. No hernia.  Musculoskeletal: Normal range of motion.  Neurological: He is alert and oriented to person, place, and time. He has normal strength. No  cranial nerve deficit or sensory deficit. Coordination normal. GCS eye subscore is 4. GCS verbal subscore is 5. GCS motor subscore is 6.  Skin: Skin is warm, dry and intact. No rash noted. No cyanosis.  Psychiatric: He has a normal mood and affect. His speech is normal and behavior is normal. Thought content normal.  Nursing note and vitals reviewed.    ED Treatments / Results  DIAGNOSTIC STUDIES: Oxygen Saturation is 98% on RA, normal by my interpretation.    COORDINATION OF CARE: 1:41 AM Discussed treatment plan  with pt at bedside and pt agreed to plan.  Labs (all labs ordered are listed, but only abnormal results are displayed) Labs Reviewed - No data to display  EKG  EKG Interpretation None       Radiology No results found.  Procedures Procedures (including critical care time)  Medications Ordered in ED Medications  diphenoxylate-atropine (LOMOTIL) 2.5-0.025 MG per tablet 2 tablet (not administered)     Initial Impression / Assessment and Plan / ED Course  I have reviewed the triage vital signs and the nursing notes.  Pertinent labs & imaging results that were available during my care of the patient were reviewed by me and considered in my medical decision making (see chart for details).  Will pursue symptomatic care with medications. Pt advised to treat body aches with tylenol or motrin at home.  Clinical Course      Final Clinical Impressions(s) / ED Diagnoses   Final diagnoses:  Influenza    New Prescriptions New Prescriptions   HYDROCODONE-HOMATROPINE (HYCODAN) 5-1.5 MG/5ML SYRUP    Take 5 mLs by mouth every 6 (six) hours as needed for cough.   IBUPROFEN (ADVIL,MOTRIN) 800 MG TABLET    Take 1 tablet (800 mg total) by mouth 3 (three) times daily.  I personally performed the services described in this documentation, which was scribed in my presence. The recorded information has been reviewed and is accurate.    Gilda Crease, MD 07/18/16 [phone]

## 2017-05-13 ENCOUNTER — Emergency Department (HOSPITAL_COMMUNITY): Payer: No Typology Code available for payment source

## 2017-05-13 ENCOUNTER — Encounter (HOSPITAL_COMMUNITY): Payer: Self-pay

## 2017-05-13 ENCOUNTER — Emergency Department (HOSPITAL_COMMUNITY)
Admission: EM | Admit: 2017-05-13 | Discharge: 2017-05-13 | Disposition: A | Discharge status: Home / Self Care | Payer: No Typology Code available for payment source | Attending: Emergency Medicine | Admitting: Emergency Medicine

## 2017-05-13 DIAGNOSIS — E119 Type 2 diabetes mellitus without complications: Secondary | ICD-10-CM | POA: Insufficient documentation

## 2017-05-13 DIAGNOSIS — M25572 Pain in left ankle and joints of left foot: Secondary | ICD-10-CM | POA: Diagnosis not present

## 2017-05-13 DIAGNOSIS — M545 Low back pain, unspecified: Secondary | ICD-10-CM

## 2017-05-13 DIAGNOSIS — M25532 Pain in left wrist: Secondary | ICD-10-CM | POA: Diagnosis not present

## 2017-05-13 DIAGNOSIS — M542 Cervicalgia: Secondary | ICD-10-CM | POA: Insufficient documentation

## 2017-05-13 DIAGNOSIS — Y999 Unspecified external cause status: Secondary | ICD-10-CM | POA: Insufficient documentation

## 2017-05-13 DIAGNOSIS — I10 Essential (primary) hypertension: Secondary | ICD-10-CM | POA: Insufficient documentation

## 2017-05-13 DIAGNOSIS — Y929 Unspecified place or not applicable: Secondary | ICD-10-CM | POA: Insufficient documentation

## 2017-05-13 DIAGNOSIS — Z87891 Personal history of nicotine dependence: Secondary | ICD-10-CM | POA: Insufficient documentation

## 2017-05-13 DIAGNOSIS — Z79899 Other long term (current) drug therapy: Secondary | ICD-10-CM | POA: Insufficient documentation

## 2017-05-13 DIAGNOSIS — Y9389 Activity, other specified: Secondary | ICD-10-CM | POA: Diagnosis not present

## 2017-05-13 HISTORY — DX: Type 2 diabetes mellitus without complications: E11.9

## 2017-05-13 HISTORY — DX: Essential (primary) hypertension: I10

## 2017-05-13 MED ORDER — ACETAMINOPHEN 500 MG PO TABS: 1000.0000 mg | ORAL_TABLET | Freq: Three times a day (TID) | ORAL | 0 refills | Status: AC

## 2017-05-13 MED ORDER — CYCLOBENZAPRINE HCL 10 MG PO TABS: 10.0000 mg | ORAL_TABLET | Freq: Every day | ORAL | 0 refills | Status: AC

## 2017-05-13 MED ORDER — ACETAMINOPHEN 500 MG PO TABS
1000.0000 mg | ORAL_TABLET | Freq: Once | ORAL | Status: AC
  Administered 2017-05-13: 1000 mg via ORAL
  Filled 2017-05-13: qty 2
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
## 2017-05-13 NOTE — ED Notes (Signed)
Patient transported to X-ray 
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
## 2018-07-11 ENCOUNTER — Emergency Department (HOSPITAL_COMMUNITY)
Admission: EM | Admit: 2018-07-11 | Discharge: 2018-07-12 | Disposition: A | Discharge status: Home / Self Care | Payer: Self-pay | Attending: Emergency Medicine | Admitting: Emergency Medicine

## 2018-07-11 ENCOUNTER — Encounter (HOSPITAL_COMMUNITY): Payer: Self-pay | Admitting: Emergency Medicine

## 2018-07-11 ENCOUNTER — Other Ambulatory Visit: Payer: Self-pay

## 2018-07-11 DIAGNOSIS — R059 Cough, unspecified: Secondary | ICD-10-CM

## 2018-07-11 DIAGNOSIS — M25572 Pain in left ankle and joints of left foot: Secondary | ICD-10-CM

## 2018-07-11 DIAGNOSIS — R05 Cough: Secondary | ICD-10-CM

## 2018-07-11 DIAGNOSIS — Z711 Person with feared health complaint in whom no diagnosis is made: Secondary | ICD-10-CM | POA: Insufficient documentation

## 2018-07-11 NOTE — ED Triage Notes (Signed)
Pt reports lower back pain for 4 days now, ankle pain for a week, and a cough 2 weeks. Pt reports the more he coughs he feels like he is going to vomit. Pt denies any recent injury.

## 2018-07-12 ENCOUNTER — Emergency Department (HOSPITAL_COMMUNITY): Payer: Self-pay

## 2018-07-12 MED ORDER — MELOXICAM 7.5 MG PO TABS: 15.0000 mg | ORAL_TABLET | Freq: Every day | ORAL | 0 refills | Status: DC

## 2018-07-12 MED ORDER — BENZONATATE 100 MG PO CAPS: 100.0000 mg | ORAL_CAPSULE | Freq: Three times a day (TID) | ORAL | 0 refills | Status: DC
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
## 2018-07-12 NOTE — Discharge Instructions (Signed)
Take the prescribed medication as directed.  Can keep wearing ace wrap to help support ankle.  Can ice and elevate as well. Follow-up with orthopedics-- call for appt. Return to the ED for new or worsening symptoms.

## 2018-07-12 NOTE — ED Notes (Signed)
Patient verbalizes understanding of discharge instructions. Opportunity for questioning and answers were provided. Armband removed by staff, pt discharged from ED home via POV.  

## 2019-11-27 ENCOUNTER — Emergency Department (HOSPITAL_COMMUNITY)
Admission: EM | Admit: 2019-11-27 | Discharge: 2019-11-27 | Disposition: A | Discharge status: Home / Self Care | Payer: Self-pay | Attending: Emergency Medicine | Admitting: Emergency Medicine

## 2019-11-27 ENCOUNTER — Other Ambulatory Visit: Payer: Self-pay

## 2019-11-27 ENCOUNTER — Encounter (HOSPITAL_COMMUNITY): Payer: Self-pay

## 2019-11-27 ENCOUNTER — Emergency Department (HOSPITAL_COMMUNITY): Payer: Self-pay

## 2019-11-27 ENCOUNTER — Ambulatory Visit (HOSPITAL_COMMUNITY)
Admission: EM | Admit: 2019-11-27 | Discharge: 2019-11-27 | Disposition: A | Discharge status: Home / Self Care | Payer: Self-pay | Attending: Family Medicine | Admitting: Family Medicine

## 2019-11-27 DIAGNOSIS — M791 Myalgia, unspecified site: Secondary | ICD-10-CM

## 2019-11-27 DIAGNOSIS — R202 Paresthesia of skin: Secondary | ICD-10-CM | POA: Insufficient documentation

## 2019-11-27 DIAGNOSIS — Z5321 Procedure and treatment not carried out due to patient leaving prior to being seen by health care provider: Secondary | ICD-10-CM | POA: Insufficient documentation

## 2019-11-27 DIAGNOSIS — M792 Neuralgia and neuritis, unspecified: Secondary | ICD-10-CM

## 2019-11-27 DIAGNOSIS — R0789 Other chest pain: Secondary | ICD-10-CM | POA: Insufficient documentation

## 2019-11-27 DIAGNOSIS — E119 Type 2 diabetes mellitus without complications: Secondary | ICD-10-CM | POA: Insufficient documentation

## 2019-11-27 DIAGNOSIS — M549 Dorsalgia, unspecified: Secondary | ICD-10-CM | POA: Insufficient documentation

## 2019-11-27 LAB — TROPONIN I (HIGH SENSITIVITY): Troponin I (High Sensitivity): 3 ng/L (ref <18)

## 2019-11-27 LAB — CBC
HCT: 46.1 % (ref 39.0–52.0)
Hemoglobin: 14 g/dL (ref 13.0–17.0)
MCH: 22.9 pg — ABNORMAL LOW (ref 26.0–34.0)
MCHC: 30.4 g/dL (ref 30.0–36.0)
MCV: 75.3 fL — ABNORMAL LOW (ref 80.0–100.0)
Platelets: 395 10*3/uL (ref 150–400)
RBC: 6.12 MIL/uL — ABNORMAL HIGH (ref 4.22–5.81)
RDW: 14.6 % (ref 11.5–15.5)
WBC: 10 10*3/uL (ref 4.0–10.5)
nRBC: 0 % (ref 0.0–0.2)

## 2019-11-27 LAB — BASIC METABOLIC PANEL
Anion gap: 14 (ref 5–15)
BUN: 11 mg/dL (ref 6–20)
CO2: 21 mmol/L — ABNORMAL LOW (ref 22–32)
Calcium: 9.6 mg/dL (ref 8.9–10.3)
Chloride: 103 mmol/L (ref 98–111)
Creatinine, Ser: 0.96 mg/dL (ref 0.61–1.24)
GFR calc Af Amer: >60 mL/min (ref >60)
GFR calc non Af Amer: >60 mL/min (ref >60)
Glucose, Bld: 125 mg/dL — ABNORMAL HIGH (ref 70–99)
Potassium: 4.3 mmol/L (ref 3.5–5.1)
Sodium: 138 mmol/L (ref 135–145)

## 2019-11-27 MED ORDER — CYCLOBENZAPRINE HCL 10 MG PO TABS: 10.0000 mg | ORAL_TABLET | Freq: Two times a day (BID) | ORAL | 0 refills | Status: DC | PRN

## 2019-11-27 MED ORDER — PREDNISONE 10 MG (21) PO TBPK: ORAL_TABLET | ORAL | 0 refills | Status: DC

## 2019-11-27 MED ORDER — SODIUM CHLORIDE 0.9% FLUSH: 3.0000 mL | Freq: Once | INTRAVENOUS | Status: DC

## 2019-11-27 NOTE — ED Triage Notes (Signed)
Pt here from home with c/o back and chest pain along with some feet tingling , pt is diabetic and has not had his meds in a few months , pt is also a smoker

## 2019-11-27 NOTE — ED Triage Notes (Signed)
Pt reports constant CP and mid-back pain worsening x 3 days.  Is worse with positions and with weight bearing on LLE.  Pt also reports SOB with increased pain.  Reports bicep pain when holding arms above head.  No known injury.  No nausea/jaw pain.  Some relief with Ibuprofen. C/o heartburn and a sneeze which made his arms feel like they were going to go numb.

## 2019-11-27 NOTE — ED Provider Notes (Signed)
[facility]    CSN: [id number] Arrival date & time: 11/27/19  0920      History   Chief Complaint Chief Complaint  Patient presents with  . Chest Pain    HPI Brent Blake is a 29 y.o. male.   Patient is a 29 year old male past medical history of diabetes, hypertension, allergies.  He presents today with constant midsternal chest pain radiating into the mid back area.  Pain down his entire spine with some intermittent numbness and tingling in bilateral upper extremities.  This is been an ongoing issue for him over the past 3 days.  Feels like his symptoms are worsening.  Has been using ibuprofen and Aleve without much relief.  Did have some heartburn after taking this medication.  Denies any cough, chest congestion, fevers.  Denies any recent strenuous exercise, heavy lifting, injuries or falls.  Currently unemployed and has not been doing much activity besides basic small chores around the house.  ROS per HPI      Past Medical History:  Diagnosis Date  . Back pain   . Diabetes mellitus without complication (HCC)   . Hypertension   . Seasonal allergies     Patient Active Problem List   Diagnosis Date Noted  . TOBACCO USER 04/18/2009  . RHINITIS, ALLERGIC 09/07/2006    Past Surgical History:  Procedure Laterality Date  . DENTAL SURGERY         Home Medications    Prior to Admission medications   Medication Sig Start Date End Date Taking? Authorizing Provider  cyclobenzaprine (FLEXERIL) 10 MG tablet Take 1 tablet (10 mg total) by mouth 2 (two) times daily as needed for muscle spasms. 11/27/19   Janine Reller, Gloris Manchester A, NP  predniSONE (STERAPRED UNI-PAK 21 TAB) 10 MG (21) TBPK tablet 6 tabs for 1 day, then 5 tabs for 1 das, then 4 tabs for 1 day, then 3 tabs for 1 day, 2 tabs for 1 day, then 1 tab for 1 day 11/27/19   Janace Aris, NP    Family History History reviewed. No pertinent family history.  Social History Social History   Tobacco Use  . Smoking  status: Former Smoker    Packs/day: 0.00    Quit date: 12/13/2014    Years since quitting: 4.9  . Smokeless tobacco: Never Used  Substance Use Topics  . Alcohol use: Yes    Comment: occasional  . Drug use: Yes    Frequency: 2.0 times per week    Types: Marijuana     Allergies   Patient has no known allergies.   Review of Systems Review of Systems   Physical Exam Triage Vital Signs ED Triage Vitals  Enc Vitals Group     BP 11/27/19 0958 (!) 145/97     Pulse Rate 11/27/19 0958 88     Resp 11/27/19 0958 18     Temp 11/27/19 0958 98.1 F (36.7 C)     Temp Source 11/27/19 0958 Oral     SpO2 11/27/19 0958 97 %     Weight --      Height --      Head Circumference --      Peak Flow --      Pain Score 11/27/19 0952 8     Pain Loc --      Pain Edu? --      Excl. in GC? --    No data found.  Updated Vital Signs BP (!) 145/97 (BP Location:  Right Arm)   Pulse 88   Temp 98.1 F (36.7 C) (Oral)   Resp 18   SpO2 97%   Visual Acuity Right Eye Distance:   Left Eye Distance:   Bilateral Distance:    Right Eye Near:   Left Eye Near:    Bilateral Near:     Physical Exam Vitals and nursing note reviewed.  Constitutional:      Appearance: Normal appearance.  HENT:     Head: Normocephalic and atraumatic.     Nose: Nose normal.  Eyes:     Conjunctiva/sclera: Conjunctivae normal.  Pulmonary:     Effort: Pulmonary effort is normal.  Chest:     Chest wall: Tenderness present.       Comments: TTP  Abdominal:     Palpations: Abdomen is soft.     Tenderness: There is no abdominal tenderness.  Musculoskeletal:        General: Normal range of motion.     Cervical back: Normal range of motion. Tenderness present. No pain with movement. Normal range of motion.     Thoracic back: Tenderness present.     Lumbar back: Tenderness present.       Back:     Comments: Tender to palpation of entire spine mostly in the musculature.  Worse in the thoracic and lumbar  area. Strength equal in upper extremities  Skin:    General: Skin is warm and dry.  Neurological:     Mental Status: He is alert.  Psychiatric:        Mood and Affect: Mood normal.      UC Treatments / Results  Labs (all labs ordered are listed, but only abnormal results are displayed) Labs Reviewed - No data to display  EKG   Radiology DG Chest 2 View  Result Date: 11/27/2019 CLINICAL DATA:  Chest pain. EXAM: CHEST - 2 VIEW COMPARISON:  July 12, 2018. FINDINGS: The heart size and mediastinal contours are within normal limits. Both lungs are clear. No pneumothorax or pleural effusion is noted. The visualized skeletal structures are unremarkable. IMPRESSION: No active cardiopulmonary disease. Electronically Signed   By: Marijo Conception M.D.   On: 11/27/2019 08:34    Procedures Procedures (including critical care time)  Medications Ordered in UC Medications - No data to display  Initial Impression / Assessment and Plan / UC Course  I have reviewed the triage vital signs and the nursing notes.  Pertinent labs & imaging results that were available during my care of the patient were reviewed by me and considered in my medical decision making (see chart for details).     Musculoskeletal pain and nerve inflammation. Most likely diagnosis.  Patient had work-up in ER prior to coming here.  Had chest x-ray, EKG and lab work which I reviewed.  All of this was not concerning. No concern for ACS at this time. Treating his symptoms with prednisone taper over the next 6 days with food.  Flexeril for muscle relaxant as needed. Believe some this could be from muscle fatigue due to decrease in activity level. Recommended when he is feeling better to do some upper body exercising to build muscle strength and hopefully prevent this from happening in the future.  Also recommended to do some stretching and improve posture He has an appointment to see his doctor on Monday for  follow-up. Final Clinical Impressions(s) / UC Diagnoses   Final diagnoses:  Nerve pain  Pain in the muscles  Discharge Instructions     Your lab work, chest x-ray and EKG from the ER were reassuring.  Not concerned for any heart related issue at this time. Believe that your symptoms are stemming from muscle spasming and nerve inflammation. We will treat this with prednisone taper over the next 6 days and muscle relaxant.  Be aware this may make you drowsy. As we spoke about when you are feeling better I recommend doing some upper body exercises to build muscle and stretching Follow-up with your doctor on Monday as planned      ED Prescriptions    Medication Sig Dispense Auth. Provider   predniSONE (STERAPRED UNI-PAK 21 TAB) 10 MG (21) TBPK tablet 6 tabs for 1 day, then 5 tabs for 1 das, then 4 tabs for 1 day, then 3 tabs for 1 day, 2 tabs for 1 day, then 1 tab for 1 day 21 tablet Murphy Duzan A, NP   cyclobenzaprine (FLEXERIL) 10 MG tablet Take 1 tablet (10 mg total) by mouth 2 (two) times daily as needed for muscle spasms. 20 tablet Dahlia Byes A, NP     PDMP not reviewed this encounter.   Janace Aris, NP 11/27/19 1113

## 2019-11-27 NOTE — Discharge Instructions (Addendum)
Your lab work, chest x-ray and EKG from the ER were reassuring.  Not concerned for any heart related issue at this time. Believe that your symptoms are stemming from muscle spasming and nerve inflammation. We will treat this with prednisone taper over the next 6 days and muscle relaxant.  Be aware this may make you drowsy. As we spoke about when you are feeling better I recommend doing some upper body exercises to build muscle and stretching Follow-up with your doctor on Monday as planned

## 2019-11-27 NOTE — ED Notes (Signed)
Bed: UC01 Expected date:  Expected time:  Means of arrival:  Comments: Brent Blake

## 2021-09-21 ENCOUNTER — Emergency Department (HOSPITAL_COMMUNITY): Payer: Self-pay

## 2021-09-21 ENCOUNTER — Encounter (HOSPITAL_COMMUNITY): Payer: Self-pay | Admitting: Family Medicine

## 2021-09-21 ENCOUNTER — Inpatient Hospital Stay (HOSPITAL_COMMUNITY)
Admission: EM | Admit: 2021-09-21 | Discharge: 2021-09-26 | DRG: 871 | Disposition: A | Discharge status: Home / Self Care | Payer: Self-pay | Attending: Family Medicine | Admitting: Family Medicine

## 2021-09-21 DIAGNOSIS — Z20822 Contact with and (suspected) exposure to covid-19: Secondary | ICD-10-CM | POA: Diagnosis present

## 2021-09-21 DIAGNOSIS — R0600 Dyspnea, unspecified: Secondary | ICD-10-CM

## 2021-09-21 DIAGNOSIS — J45909 Unspecified asthma, uncomplicated: Secondary | ICD-10-CM

## 2021-09-21 DIAGNOSIS — K209 Esophagitis, unspecified without bleeding: Secondary | ICD-10-CM | POA: Diagnosis present

## 2021-09-21 DIAGNOSIS — K226 Gastro-esophageal laceration-hemorrhage syndrome: Secondary | ICD-10-CM | POA: Diagnosis present

## 2021-09-21 DIAGNOSIS — R062 Wheezing: Secondary | ICD-10-CM

## 2021-09-21 DIAGNOSIS — A419 Sepsis, unspecified organism: Principal | ICD-10-CM

## 2021-09-21 DIAGNOSIS — E875 Hyperkalemia: Secondary | ICD-10-CM

## 2021-09-21 DIAGNOSIS — E871 Hypo-osmolality and hyponatremia: Secondary | ICD-10-CM

## 2021-09-21 DIAGNOSIS — R1310 Dysphagia, unspecified: Secondary | ICD-10-CM

## 2021-09-21 DIAGNOSIS — K920 Hematemesis: Secondary | ICD-10-CM

## 2021-09-21 DIAGNOSIS — R6520 Severe sepsis without septic shock: Secondary | ICD-10-CM

## 2021-09-21 DIAGNOSIS — Z87891 Personal history of nicotine dependence: Secondary | ICD-10-CM

## 2021-09-21 DIAGNOSIS — E1110 Type 2 diabetes mellitus with ketoacidosis without coma: Principal | ICD-10-CM | POA: Diagnosis present

## 2021-09-21 DIAGNOSIS — Z9114 Patient's other noncompliance with medication regimen: Secondary | ICD-10-CM

## 2021-09-21 DIAGNOSIS — J189 Pneumonia, unspecified organism: Secondary | ICD-10-CM

## 2021-09-21 DIAGNOSIS — E876 Hypokalemia: Secondary | ICD-10-CM

## 2021-09-21 DIAGNOSIS — I10 Essential (primary) hypertension: Secondary | ICD-10-CM

## 2021-09-21 DIAGNOSIS — G9341 Metabolic encephalopathy: Secondary | ICD-10-CM

## 2021-09-21 DIAGNOSIS — E669 Obesity, unspecified: Secondary | ICD-10-CM

## 2021-09-21 DIAGNOSIS — E1111 Type 2 diabetes mellitus with ketoacidosis with coma: Secondary | ICD-10-CM | POA: Diagnosis present

## 2021-09-21 DIAGNOSIS — Z6833 Body mass index (BMI) 33.0-33.9, adult: Secondary | ICD-10-CM

## 2021-09-21 DIAGNOSIS — N179 Acute kidney failure, unspecified: Secondary | ICD-10-CM

## 2021-09-21 LAB — COMPREHENSIVE METABOLIC PANEL
ALT: 40 U/L (ref 0–44)
AST: 22 U/L (ref 15–41)
Albumin: 4 g/dL (ref 3.5–5.0)
Alkaline Phosphatase: 99 U/L (ref 38–126)
Anion gap: 36 — ABNORMAL HIGH (ref 5–15)
BUN: 66 mg/dL — ABNORMAL HIGH (ref 6–20)
CO2: 7 mmol/L — ABNORMAL LOW (ref 22–32)
Calcium: 7.9 mg/dL — ABNORMAL LOW (ref 8.9–10.3)
Chloride: 71 mmol/L — ABNORMAL LOW (ref 98–111)
Creatinine, Ser: 3.95 mg/dL — ABNORMAL HIGH (ref 0.61–1.24)
GFR, Estimated: 20 mL/min — ABNORMAL LOW (ref >60)
Glucose, Bld: 1095 mg/dL (ref 70–99)
Potassium: 5.7 mmol/L — ABNORMAL HIGH (ref 3.5–5.1)
Sodium: 114 mmol/L — CL (ref 135–145)
Total Bilirubin: 1 mg/dL (ref 0.3–1.2)
Total Protein: 7.9 g/dL (ref 6.5–8.1)

## 2021-09-21 LAB — GLUCOSE, CAPILLARY
Glucose-Capillary: 370 mg/dL — ABNORMAL HIGH (ref 70–99)
Glucose-Capillary: 371 mg/dL — ABNORMAL HIGH (ref 70–99)
Glucose-Capillary: 404 mg/dL — ABNORMAL HIGH (ref 70–99)
Glucose-Capillary: 405 mg/dL — ABNORMAL HIGH (ref 70–99)
Glucose-Capillary: 450 mg/dL — ABNORMAL HIGH (ref 70–99)
Glucose-Capillary: 464 mg/dL — ABNORMAL HIGH (ref 70–99)
Glucose-Capillary: 486 mg/dL — ABNORMAL HIGH (ref 70–99)
Glucose-Capillary: 501 mg/dL (ref 70–99)
Glucose-Capillary: 535 mg/dL (ref 70–99)
Glucose-Capillary: >600 mg/dL (ref 70–99)
Glucose-Capillary: >600 mg/dL (ref 70–99)
Glucose-Capillary: >600 mg/dL (ref 70–99)
Glucose-Capillary: >600 mg/dL (ref 70–99)
Glucose-Capillary: >600 mg/dL (ref 70–99)
Glucose-Capillary: >600 mg/dL (ref 70–99)
Glucose-Capillary: >600 mg/dL (ref 70–99)
Glucose-Capillary: >600 mg/dL (ref 70–99)

## 2021-09-21 LAB — BLOOD GAS, VENOUS
Acid-base deficit: 22.7 mmol/L — ABNORMAL HIGH (ref 0.0–2.0)
Bicarbonate: 7.3 mmol/L — ABNORMAL LOW (ref 20.0–28.0)
O2 Saturation: 58 %
Patient temperature: 37
pCO2, Ven: 29 mmHg — ABNORMAL LOW (ref 44–60)
pH, Ven: 7.01 — CL (ref 7.25–7.43)
pO2, Ven: 34 mmHg (ref 32–45)

## 2021-09-21 LAB — CBC WITH DIFFERENTIAL/PLATELET
Abs Immature Granulocytes: 0.83 10*3/uL — ABNORMAL HIGH (ref 0.00–0.07)
Basophils Absolute: 0.1 10*3/uL (ref 0.0–0.1)
Basophils Relative: 0 %
Eosinophils Absolute: 0 10*3/uL (ref 0.0–0.5)
Eosinophils Relative: 0 %
HCT: 42.6 % (ref 39.0–52.0)
Hemoglobin: 13.6 g/dL (ref 13.0–17.0)
Immature Granulocytes: 3 %
Lymphocytes Relative: 5 %
Lymphs Abs: 1.3 10*3/uL (ref 0.7–4.0)
MCH: 22.7 pg — ABNORMAL LOW (ref 26.0–34.0)
MCHC: 31.9 g/dL (ref 30.0–36.0)
MCV: 71 fL — ABNORMAL LOW (ref 80.0–100.0)
Monocytes Absolute: 2.2 10*3/uL — ABNORMAL HIGH (ref 0.1–1.0)
Monocytes Relative: 8 %
Neutro Abs: 23.6 10*3/uL — ABNORMAL HIGH (ref 1.7–7.7)
Neutrophils Relative %: 84 %
Platelets: 488 10*3/uL — ABNORMAL HIGH (ref 150–400)
RBC: 6 MIL/uL — ABNORMAL HIGH (ref 4.22–5.81)
RDW: 14.6 % (ref 11.5–15.5)
WBC: 27.9 10*3/uL — ABNORMAL HIGH (ref 4.0–10.5)
nRBC: 0 % (ref 0.0–0.2)

## 2021-09-21 LAB — BETA-HYDROXYBUTYRIC ACID
Beta-Hydroxybutyric Acid: >8 mmol/L — ABNORMAL HIGH (ref 0.05–0.27)
Beta-Hydroxybutyric Acid: >8 mmol/L — ABNORMAL HIGH (ref 0.05–0.27)

## 2021-09-21 LAB — BASIC METABOLIC PANEL
Anion gap: 25 — ABNORMAL HIGH (ref 5–15)
BUN: 67 mg/dL — ABNORMAL HIGH (ref 6–20)
BUN: 64 mg/dL — ABNORMAL HIGH (ref 6–20)
CO2: <7 mmol/L — ABNORMAL LOW (ref 22–32)
CO2: 9 mmol/L — ABNORMAL LOW (ref 22–32)
Calcium: 7 mg/dL — ABNORMAL LOW (ref 8.9–10.3)
Calcium: 8 mg/dL — ABNORMAL LOW (ref 8.9–10.3)
Chloride: 77 mmol/L — ABNORMAL LOW (ref 98–111)
Chloride: 88 mmol/L — ABNORMAL LOW (ref 98–111)
Creatinine, Ser: 3.67 mg/dL — ABNORMAL HIGH (ref 0.61–1.24)
Creatinine, Ser: 3.16 mg/dL — ABNORMAL HIGH (ref 0.61–1.24)
GFR, Estimated: 22 mL/min — ABNORMAL LOW (ref >60)
GFR, Estimated: 26 mL/min — ABNORMAL LOW (ref >60)
Glucose, Bld: 924 mg/dL (ref 70–99)
Glucose, Bld: 557 mg/dL (ref 70–99)
Potassium: 4.9 mmol/L (ref 3.5–5.1)
Potassium: 4.2 mmol/L (ref 3.5–5.1)
Sodium: 114 mmol/L — CL (ref 135–145)
Sodium: 122 mmol/L — ABNORMAL LOW (ref 135–145)

## 2021-09-21 LAB — I-STAT CHEM 8, ED
BUN: 56 mg/dL — ABNORMAL HIGH (ref 6–20)
Calcium, Ion: 0.99 mmol/L — ABNORMAL LOW (ref 1.15–1.40)
Chloride: 79 mmol/L — ABNORMAL LOW (ref 98–111)
Creatinine, Ser: 4.2 mg/dL — ABNORMAL HIGH (ref 0.61–1.24)
Glucose, Bld: >700 mg/dL (ref 70–99)
HCT: 44 % (ref 39.0–52.0)
Hemoglobin: 15 g/dL (ref 13.0–17.0)
Potassium: 5.7 mmol/L — ABNORMAL HIGH (ref 3.5–5.1)
Sodium: 112 mmol/L — CL (ref 135–145)
TCO2: 10 mmol/L — ABNORMAL LOW (ref 22–32)

## 2021-09-21 LAB — CBG MONITORING, ED
Glucose-Capillary: >600 mg/dL (ref 70–99)
Glucose-Capillary: >600 mg/dL (ref 70–99)
Glucose-Capillary: >600 mg/dL (ref 70–99)
Glucose-Capillary: >600 mg/dL (ref 70–99)
Glucose-Capillary: >600 mg/dL (ref 70–99)

## 2021-09-21 LAB — BLOOD GAS, ARTERIAL
Acid-base deficit: 23.1 mmol/L — ABNORMAL HIGH (ref 0.0–2.0)
Bicarbonate: 3.9 mmol/L — ABNORMAL LOW (ref 20.0–28.0)
Drawn by: 25770
FIO2: 21 %
O2 Saturation: 99.8 %
Patient temperature: 35.7
pCO2 arterial: <18 mmHg — CL (ref 32–48)
pH, Arterial: 7.14 — CL (ref 7.35–7.45)
pO2, Arterial: 84 mmHg (ref 83–108)

## 2021-09-21 LAB — HIV ANTIBODY (ROUTINE TESTING W REFLEX): HIV Screen 4th Generation wRfx: NONREACTIVE

## 2021-09-21 LAB — PROTIME-INR
INR: 1.2 (ref 0.8–1.2)
Prothrombin Time: 15.4 seconds — ABNORMAL HIGH (ref 11.4–15.2)

## 2021-09-21 LAB — LACTIC ACID, PLASMA
Lactic Acid, Venous: 2.8 mmol/L (ref 0.5–1.9)
Lactic Acid, Venous: 1.5 mmol/L (ref 0.5–1.9)

## 2021-09-21 LAB — MRSA NEXT GEN BY PCR, NASAL: MRSA by PCR Next Gen: NOT DETECTED

## 2021-09-21 LAB — CK: Total CK: 806 U/L — ABNORMAL HIGH (ref 49–397)

## 2021-09-21 LAB — LIPASE, BLOOD: Lipase: 185 U/L — ABNORMAL HIGH (ref 11–51)

## 2021-09-21 LAB — OSMOLALITY: Osmolality: 357 mOsm/kg (ref 275–295)

## 2021-09-21 LAB — RESP PANEL BY RT-PCR (FLU A&B, COVID) ARPGX2
Influenza A by PCR: NEGATIVE
Influenza B by PCR: NEGATIVE
SARS Coronavirus 2 by RT PCR: NEGATIVE

## 2021-09-21 MED ORDER — PANTOPRAZOLE SODIUM 40 MG IV SOLR
40.0000 mg | Freq: Once | INTRAVENOUS | Status: AC
Start: 2021-09-21 — End: 2021-09-21
  Administered 2021-09-21: 40 mg via INTRAVENOUS
  Filled 2021-09-21: qty 10

## 2021-09-21 MED ORDER — ACETAMINOPHEN 650 MG RE SUPP: 650.0000 mg | Freq: Four times a day (QID) | RECTAL | Status: DC | PRN

## 2021-09-21 MED ORDER — ONDANSETRON HCL 4 MG/2ML IJ SOLN
4.0000 mg | Freq: Once | INTRAMUSCULAR | Status: AC
  Administered 2021-09-21: 4 mg via INTRAVENOUS
  Filled 2021-09-21: qty 2

## 2021-09-21 MED ORDER — VANCOMYCIN HCL 2000 MG/400ML IV SOLN
2000.0000 mg | Freq: Once | INTRAVENOUS | Status: AC
  Administered 2021-09-21: 2000 mg via INTRAVENOUS
  Filled 2021-09-21: qty 400

## 2021-09-21 MED ORDER — ENOXAPARIN SODIUM 40 MG/0.4ML IJ SOSY
40.0000 mg | PREFILLED_SYRINGE | INTRAMUSCULAR | Status: DC
  Administered 2021-09-21 – 2021-09-25 (×5): 40 mg via SUBCUTANEOUS
  Filled 2021-09-21 (×5): qty 0.4

## 2021-09-21 MED ORDER — ONDANSETRON HCL 4 MG/2ML IJ SOLN
4.0000 mg | Freq: Four times a day (QID) | INTRAMUSCULAR | Status: DC | PRN
  Administered 2021-09-21 – 2021-09-22 (×2): 4 mg via INTRAVENOUS
  Filled 2021-09-21 (×2): qty 2

## 2021-09-21 MED ORDER — OXYCODONE HCL 5 MG PO TABS: 5.0000 mg | ORAL_TABLET | ORAL | Status: DC | PRN

## 2021-09-21 MED ORDER — INSULIN REGULAR(HUMAN) IN NACL 100-0.9 UT/100ML-% IV SOLN
INTRAVENOUS | Status: DC
  Administered 2021-09-21 (×2): 5 [IU]/h via INTRAVENOUS
  Filled 2021-09-21: qty 100

## 2021-09-21 MED ORDER — LACTATED RINGERS IV BOLUS
1000.0000 mL | INTRAVENOUS | Status: DC
  Administered 2021-09-21: 1000 mL via INTRAVENOUS

## 2021-09-21 MED ORDER — LACTATED RINGERS IV SOLN: INTRAVENOUS | Status: DC

## 2021-09-21 MED ORDER — DEXTROSE IN LACTATED RINGERS 5 % IV SOLN: INTRAVENOUS | Status: DC

## 2021-09-21 MED ORDER — METRONIDAZOLE 500 MG/100ML IV SOLN
500.0000 mg | Freq: Once | INTRAVENOUS | Status: DC
  Administered 2021-09-21: 500 mg via INTRAVENOUS
  Filled 2021-09-21: qty 100

## 2021-09-21 MED ORDER — VANCOMYCIN HCL IN DEXTROSE 1-5 GM/200ML-% IV SOLN
1000.0000 mg | Freq: Once | INTRAVENOUS | Status: DC
Start: 2021-09-21 — End: 2021-09-21

## 2021-09-21 MED ORDER — PANTOPRAZOLE SODIUM 40 MG PO TBEC
40.0000 mg | DELAYED_RELEASE_TABLET | Freq: Two times a day (BID) | ORAL | Status: DC
  Filled 2021-09-21: qty 1

## 2021-09-21 MED ORDER — CHLORHEXIDINE GLUCONATE CLOTH 2 % EX PADS
6.0000 | MEDICATED_PAD | Freq: Every day | CUTANEOUS | Status: DC
  Administered 2021-09-21 – 2021-09-25 (×5): 6 via TOPICAL

## 2021-09-21 MED ORDER — ACETAMINOPHEN 325 MG PO TABS: 650.0000 mg | ORAL_TABLET | Freq: Four times a day (QID) | ORAL | Status: DC | PRN

## 2021-09-21 MED ORDER — ONDANSETRON HCL 4 MG PO TABS: 4.0000 mg | ORAL_TABLET | Freq: Four times a day (QID) | ORAL | Status: DC | PRN

## 2021-09-21 MED ORDER — DEXTROSE 50 % IV SOLN: 0.0000 mL | INTRAVENOUS | Status: DC | PRN

## 2021-09-21 MED ORDER — LACTATED RINGERS IV BOLUS
20.0000 mL/kg | Freq: Once | INTRAVENOUS | Status: AC
  Administered 2021-09-21: 2358 mL via INTRAVENOUS

## 2021-09-21 MED ORDER — INSULIN REGULAR(HUMAN) IN NACL 100-0.9 UT/100ML-% IV SOLN
INTRAVENOUS | Status: DC
  Administered 2021-09-21: 5 [IU]/h via INTRAVENOUS
  Administered 2021-09-22: 4.2 [IU]/h via INTRAVENOUS
  Administered 2021-09-23: 7.5 [IU]/h via INTRAVENOUS
  Filled 2021-09-21 (×3): qty 100

## 2021-09-21 MED ORDER — SODIUM CHLORIDE 0.9 % IV SOLN
500.0000 mg | INTRAVENOUS | Status: AC
  Administered 2021-09-21 – 2021-09-25 (×5): 500 mg via INTRAVENOUS
  Filled 2021-09-21 (×5): qty 5

## 2021-09-21 MED ORDER — SODIUM CHLORIDE 0.9 % IV SOLN
2.0000 g | Freq: Once | INTRAVENOUS | Status: AC
  Administered 2021-09-21: 2 g via INTRAVENOUS
  Filled 2021-09-21: qty 2

## 2021-09-21 MED ORDER — VANCOMYCIN HCL 500 MG/100ML IV SOLN
500.0000 mg | Freq: Once | INTRAVENOUS | Status: DC
  Filled 2021-09-21: qty 100

## 2021-09-21 MED ORDER — SODIUM CHLORIDE 0.9 % IV SOLN
2.0000 g | INTRAVENOUS | Status: AC
  Administered 2021-09-21 – 2021-09-25 (×5): 2 g via INTRAVENOUS
  Filled 2021-09-21 (×6): qty 20

## 2021-09-21 MED ORDER — METOCLOPRAMIDE HCL 5 MG/ML IJ SOLN
10.0000 mg | Freq: Once | INTRAMUSCULAR | Status: DC
  Administered 2021-09-21: 10 mg via INTRAVENOUS
  Filled 2021-09-21: qty 2

## 2021-09-21 NOTE — Assessment & Plan Note (Addendum)
-   Sodium improved to 137 ?

## 2021-09-21 NOTE — Assessment & Plan Note (Deleted)
-   IV fluids every 4 hours BMP ?

## 2021-09-21 NOTE — Progress Notes (Signed)
A consult was received from an ED physician for vancomycin & Cefepime per pharmacy dosing.  The patient's profile has been reviewed for ht/wt/allergies/indication/available labs.   ? ?A one time order has been placed for vancomycin 2000 mg followed by 500 mg for total dose 2500 mg and cefepime 2 gm.  ? ? Further antibiotics/pharmacy consults should be ordered by admitting physician if indicated.       ?                ?Thank you, ? ?Herby Abraham, Pharm.D ?09/21/2021 1:45 PM ? ?

## 2021-09-21 NOTE — Assessment & Plan Note (Signed)
Estimated body mass index is 33.58 kg/m as calculated from the following:   Height as of this encounter: 5' (1.524 m).   Weight as of this encounter: 78 kg.   -This will complicate overall prognosis 

## 2021-09-21 NOTE — ED Notes (Signed)
Pt uncooperative with [facility]. Warm blanket x1 remains on pt.  ?

## 2021-09-21 NOTE — Assessment & Plan Note (Addendum)
Reported hematemesis in ER with bright red streaking.   Probably Mallory-Weiss tear given the extent of vomiting per partner, low suspicion at present for esophageal varices or gastric ulcer. ?- Obtained CT chest/CT neck today ?-Only shows thickening of esophagus consistent with esophagitis ?-Continue Protonix 40 mg IV twice daily ? ?

## 2021-09-21 NOTE — ED Provider Notes (Signed)
?[facility]-EMERGENCY DEPT ?Provider Note ? ? ?CSN: [id number] ?Arrival date & time: 09/21/21  1132 ? ?  ? ?History ? ?Chief Complaint  ?Patient presents with  ? Hyperglycemia  ? ? ?Brent Blake is a 31 y.o. male. ? ?HPI ? ?31 year old male with history of back pain, diabetes, hypertension, seasonal allergies, who presents to the emergency department today for evaluation of hyperglycemia.  History is limited as patient is nonverbal on my evaluation.  Per his girlfriend and per EMS report, he has had nausea and vomiting for the last several days.  He has been noncompliant with his medications for several months. ? ?Home Medications ?Prior to Admission medications   ?Not on File  ?   ? ?Allergies    ?Patient has no known allergies.   ? ?Review of Systems   ?Review of Systems ?See HPI for pertinent positives or negatives. ? ? ?Physical Exam ?Updated Vital Signs ?BP 123/69   Pulse (!) 112   Temp (!) 95.2 ?F (35.1 ?C) (Rectal)   Resp (!) 30   Ht 6\' 2"  (1.88 m)   Wt 117.9 kg   SpO2 97%   BMI 33.37 kg/m?  ?Physical Exam ?Vitals and nursing note reviewed.  ?Constitutional:   ?   General: He is not in acute distress. ?   Appearance: He is well-developed.  ?HENT:  ?   Head: Normocephalic and atraumatic.  ?Eyes:  ?   Conjunctiva/sclera: Conjunctivae normal.  ?Cardiovascular:  ?   Rate and Rhythm: Regular rhythm. Tachycardia present.  ?   Heart sounds: No murmur heard. ?Pulmonary:  ?   Effort: Pulmonary effort is normal.  ?   Breath sounds: Wheezing present.  ?   Comments: Tachypnea ?Abdominal:  ?   General: Bowel sounds are normal.  ?   Palpations: Abdomen is soft.  ?   Tenderness: There is abdominal tenderness (diffuse ttp).  ?Musculoskeletal:     ?   General: No swelling.  ?   Cervical back: Neck supple.  ?Skin: ?   General: Skin is warm and dry.  ?   Capillary Refill: Capillary refill takes less than 2 seconds.  ?Neurological:  ?   Mental Status: He is alert.  ?Psychiatric:     ?   Mood and  Affect: Mood normal.  ? ? ?ED Results / Procedures / Treatments   ?Labs ?(all labs ordered are listed, but only abnormal results are displayed) ?Labs Reviewed  ?OSMOLALITY - Abnormal; Notable for the following components:  ?    Result Value  ? Osmolality 357 (*)   ? All other components within normal limits  ?CBC WITH DIFFERENTIAL/PLATELET - Abnormal; Notable for the following components:  ? WBC 27.9 (*)   ? RBC 6.00 (*)   ? MCV 71.0 (*)   ? MCH 22.7 (*)   ? Platelets 488 (*)   ? Neutro Abs 23.6 (*)   ? Monocytes Absolute 2.2 (*)   ? Abs Immature Granulocytes 0.83 (*)   ? All other components within normal limits  ?BETA-HYDROXYBUTYRIC ACID - Abnormal; Notable for the following components:  ? Beta-Hydroxybutyric Acid >8.00 (*)   ? All other components within normal limits  ?COMPREHENSIVE METABOLIC PANEL - Abnormal; Notable for the following components:  ? Sodium 114 (*)   ? Potassium 5.7 (*)   ? Chloride 71 (*)   ? CO2 7 (*)   ? Glucose, Bld 1,095 (*)   ? BUN 66 (*)   ? Creatinine, Ser 3.95 (*)   ?  Calcium 7.9 (*)   ? GFR, Estimated 20 (*)   ? Anion gap 36 (*)   ? All other components within normal limits  ?LIPASE, BLOOD - Abnormal; Notable for the following components:  ? Lipase 185 (*)   ? All other components within normal limits  ?BLOOD GAS, VENOUS - Abnormal; Notable for the following components:  ? pH, Ven 7.01 (*)   ? pCO2, Ven 29 (*)   ? Bicarbonate 7.3 (*)   ? Acid-base deficit 22.7 (*)   ? All other components within normal limits  ?LACTIC ACID, PLASMA - Abnormal; Notable for the following components:  ? Lactic Acid, Venous 2.8 (*)   ? All other components within normal limits  ?PROTIME-INR - Abnormal; Notable for the following components:  ? Prothrombin Time 15.4 (*)   ? All other components within normal limits  ?CBG MONITORING, ED - Abnormal; Notable for the following components:  ? Glucose-Capillary >600 (*)   ? All other components within normal limits  ?CBG MONITORING, ED - Abnormal; Notable for the  following components:  ? Glucose-Capillary >600 (*)   ? All other components within normal limits  ?I-STAT CHEM 8, ED - Abnormal; Notable for the following components:  ? Sodium 112 (*)   ? Potassium 5.7 (*)   ? Chloride 79 (*)   ? BUN 56 (*)   ? Creatinine, Ser 4.20 (*)   ? Glucose, Bld >700 (*)   ? Calcium, Ion 0.99 (*)   ? TCO2 10 (*)   ? All other components within normal limits  ?CBG MONITORING, ED - Abnormal; Notable for the following components:  ? Glucose-Capillary >600 (*)   ? All other components within normal limits  ?CBG MONITORING, ED - Abnormal; Notable for the following components:  ? Glucose-Capillary >600 (*)   ? All other components within normal limits  ?CBG MONITORING, ED - Abnormal; Notable for the following components:  ? Glucose-Capillary >600 (*)   ? All other components within normal limits  ?RESP PANEL BY RT-PCR (FLU A&B, COVID) [id number]  ?CULTURE, BLOOD (ROUTINE X 2)  ?CULTURE, BLOOD (ROUTINE X 2)  ?MRSA NEXT GEN BY PCR, NASAL  ?URINALYSIS, ROUTINE W REFLEX MICROSCOPIC  ?BETA-HYDROXYBUTYRIC ACID  ?BASIC METABOLIC PANEL  ?BASIC METABOLIC PANEL  ?BASIC METABOLIC PANEL  ?BETA-HYDROXYBUTYRIC ACID  ?BETA-HYDROXYBUTYRIC ACID  ?LACTIC ACID, PLASMA  ?BASIC METABOLIC PANEL  ?CK  ? ? ?EKG ?EKG Interpretation ? ?Date/Time:  Tuesday September 21 2021 11:56:51 EDT ?Ventricular Rate:  106 ?PR Interval:  139 ?QRS Duration: 99 ?QT Interval:  368 ?QTC Calculation: 489 ?R Axis:   57 ?Text Interpretation: Sinus tachycardia Minimal ST depression, inferior leads Prolonged QT interval downsloping st segment in lead III Otherwise no significant change Confirmed by Melene Plan [phone]) on 09/21/2021 12:40:19 PM ? ?Radiology ?DG CHEST PORT 1 VIEW ? ?Result Date: 09/21/2021 ?CLINICAL DATA:  Hyperglycemic.  Wheezing.  Nausea and vomiting. EXAM: PORTABLE CHEST 1 VIEW COMPARISON:  Chest two views 11/27/2019 FINDINGS: Single frontal view of the chest. The patient's chin obscures the superior aspect of the left hemithorax. There  are moderately decreased lung volumes. Within this limitation, the cardiac silhouette and mediastinal contours are grossly within normal limits. The right lung is grossly clear. There is left basilar heterogeneous opacification. No definite pleural effusion. No pneumothorax. No definite acute skeletal abnormality. IMPRESSION: Limited study as described above. Decreased lung volumes with left basilar opacification that may represent atelectasis versus pneumonia. Electronically Signed   By: Kerin Salen.D.  On: 09/21/2021 14:18   ? ?Procedures ?[facility] Kitchen.Critical Care ?Performed by: Karrie Meresouture, Banner Huckaba S, PA-C ?Authorized by: Karrie Meresouture, Javiana Anwar S, PA-C  ? ?Critical care provider statement:  ?  Critical care time (minutes):  38 ?  Critical care time was exclusive of:  Separately billable procedures and treating other patients and teaching time ?  Critical care was necessary to treat or prevent imminent or life-threatening deterioration of the following conditions:  Endocrine crisis ?  Critical care was time spent personally by me on the following activities:  Development of treatment plan with patient or surrogate, discussions with consultants, evaluation of patient's response to treatment, examination of patient, ordering and review of laboratory studies, ordering and review of radiographic studies, ordering and performing treatments and interventions, pulse oximetry, re-evaluation of patient's condition and review of old charts ?  I assumed direction of critical care for this patient from another provider in my specialty: no   ?  Care discussed with: admitting provider    ? ? ?Medications Ordered in ED ?Medications  ?insulin regular, human (MYXREDLIN) 100 units/ 100 mL infusion (5 Units/hr Intravenous New Bag/Given 09/21/21 1359)  ?lactated ringers infusion ( Intravenous Not Given 09/21/21 1341)  ?dextrose 5 % in lactated ringers infusion (has no administration in time range)  ?dextrose 50 % solution 0-50 mL (has no administration  in time range)  ?lactated ringers infusion ( Intravenous New Bag/Given 09/21/21 1338)  ?metroNIDAZOLE (FLAGYL) IVPB 500 mg (has no administration in time range)  ?vancomycin (VANCOREADY) IVPB 2000 mg/400 mL (has

## 2021-09-21 NOTE — ED Notes (Signed)
Unable to obtain oral temp on arrival. Rectal temp obtained at this time with PA-C Cortni Couture witnessing. Rectal temp 95.79F at this time. Pt placed on Bair Hugger at this time.  ?

## 2021-09-21 NOTE — Assessment & Plan Note (Addendum)
-   Blood pressure has been elevated ?-Continue IV hydralazine as needed ?

## 2021-09-21 NOTE — Hospital Course (Signed)
Brent Blake is a 31 y.o. M with T2DM never on treatment, smoking, and HTN and obesity who presented with lethargy, vomiting, as well as cough, confusion.' ? ?All history collected from partner at bedside.  Evidently, few days ago, was coughing real bad, vomiting all over the house.  Partner took him to her house, was trying to treat him there, but eventually realized and told him this was "Not no regular cold, you got to go the hospital."  Unfortunately, by htat time he was confused and couldn't even stand, so EMS were activated.   ? ?She thinks he has a history of diabetes and hypertension.  I also see albuterol listed in his prior meds.    ? ?In the ER, Blood sugar >1000, anion gap 36, Bicarb 7, VBG showed pH 7.1.  He appeared lethargic and unresponsive.  CXR had a left base opacity.  He had reported hematemesis.  Was given 2L fluids, started on insulin drip and antibiotics. ? ? ? ?

## 2021-09-21 NOTE — ED Notes (Signed)
RN/PA notified of abnormal labs ?

## 2021-09-21 NOTE — Assessment & Plan Note (Addendum)
Cr 3.9 from baseline in 2021 of 0.9.   ?- Started on IV fluids ?-Creatinine has improved to 1.10 ?

## 2021-09-21 NOTE — ED Notes (Signed)
This nurse entered the pt's room to find he had pulled out 20g RAC peripheral IV with fluid bolus running onto the floor. Will re-attempt access.  ?

## 2021-09-21 NOTE — ED Notes (Addendum)
PA-C notified and aware pt's BG 1095 and Sodium 114. ?

## 2021-09-21 NOTE — Assessment & Plan Note (Addendum)
Diabetes noted in chart as far back as 2018, partner says she has never seen him take medicine int he time she has known him (~69yr) and I see no record in notes of ever being prescribed medicine. ? ?-Presented with severe DKA and altered mental status ?-DKA has resolved ?-Mental status back to baseline ?-IV insulin has been discontinued ?-Patient started on Lantus 10 units subcu twice daily, sliding scale insulin with NovoLog ?-NovoLog 3 units 3 times daily with meals ? ? ? ? ? ? ?

## 2021-09-21 NOTE — ED Notes (Signed)
PA-C at the bedside.  ?

## 2021-09-21 NOTE — Assessment & Plan Note (Addendum)
Stable

## 2021-09-21 NOTE — ED Notes (Signed)
Critical pH received from lab 7.01 at this time. PA-C Cortni Couture notified and aware.  ?

## 2021-09-21 NOTE — Assessment & Plan Note (Addendum)
Due to DKA and/or sepsis.   ?-Serum osmolality was high at 324 ?-Repeat serum osmolality 301 from 09/23/2021 ? ?

## 2021-09-21 NOTE — ED Notes (Signed)
Xray at the bedside.

## 2021-09-21 NOTE — ED Notes (Signed)
Brent Blake ?Pt's significant other ?[phone] ?

## 2021-09-21 NOTE — ED Notes (Signed)
Discussed with ICU RN, pt needs insulin drip adjusted per [employer] recommendations, on arrival to the ICU. ICU nurse notified and aware pt still needs to receive the ordered Flagyl and Vanc at this time as well as repeat ordered blood work.  ?

## 2021-09-21 NOTE — Assessment & Plan Note (Addendum)
Presented with hypothermia, tachycardia leukocytosis and LLL opacity with renal failure and encephalopathy.  Cannot rule out this is severe sepsis. ? ?- Continue Rocephin and azithromycin ?- Repeat lactate is 1.5 ?- Follow blood cultures ?

## 2021-09-21 NOTE — H&P (Addendum)
?History and Physical  ? ? ?Patient: Brent Blake [id number] DOB: [birth year] ?DOA: 09/21/2021 ?DOS: the patient was seen and examined on 09/21/2021 ?PCP: Riley Churches, MD  ?Patient coming from: Home ? ?Chief Complaint:  ?Chief Complaint  ?Patient presents with  ? Hyperglycemia  ? ?HPI: ?Mr. Hoard is a 31 y.o. M with T2DM never on treatment, smoking, and HTN and obesity who presented with lethargy, vomiting, as well as cough, confusion.' ? ?All history collected from partner at bedside.  Evidently, few days ago, was coughing real bad, vomiting all over the house.  Partner took him to her house, was trying to treat him there, but eventually realized and told him this was "Not no regular cold, you got to go the hospital."  Unfortunately, by htat time he was confused and couldn't even stand, so EMS were activated.   ? ?She thinks he has a history of diabetes and hypertension.  I also see albuterol listed in his prior meds.    ? ?In the ER, Blood sugar >1000, anion gap 36, Bicarb 7, VBG showed pH 7.1.  He appeared lethargic and unresponsive.  CXR had a left base opacity.  He had reported hematemesis.  Was given 2L fluids, started on insulin drip and antibiotics. ? ? ?   ? ? ? ? ?Review of Systems: Review of Systems  ?Unable to perform ROS: Patient unresponsive  ?Vomiting and cough per partner. ? ? ? ?Past Medical History:  ?Diagnosis Date  ? Back pain   ? Diabetes mellitus without complication ([place])   ? Hypertension   ? Seasonal allergies   ? ?Past Surgical History:  ?Procedure Laterality Date  ? DENTAL SURGERY    ? ?Social History:  reports that he quit smoking about 6 years ago. He has never used smokeless tobacco. He reports current alcohol use. He reports current drug use. Frequency: 2.00 times per week. Drug: Marijuana. ? ?No Known Allergies ? ?History reviewed. No pertinent family history. ? ?Prior to Admission medications   ?Not on File  ? ? ?Physical Exam: ?Vitals:  ? 09/21/21 1430 09/21/21 1445 09/21/21  1516 09/21/21 1600  ?BP: 123/69   (!) 141/16  ?Pulse: (!) 112 (!) 113 (!) 117 (!) 121  ?Resp: (!) 30   (!) 44  ?Temp:    (!) 96.2 ?F (35.7 ?C)  ?TempSrc:    Axillary  ?SpO2: 97% 98% 100% 99%  ?Weight:      ?Height:      ? ?Obese adult male, lethargic, glassy eyed, makes brief eye contact but does not follow commands, and only capable of unintelligible mumbling. ?Anicteric, conjunctival pink, lids and lashes normal, lips dry, oropharynx not visualized ?Tachycardic, regular, systolic flow murmur noted, no peripheral edema, no JVD ?Severely tachypneic, rhonchorous in bilateral anterior lung fields ?Abdomen without grimace to palpation, soft, no distention, no rigidity ?Attention diminished, moves upper and lower extremities restlessly, was seemingly symmetric strength, face symmetric, unintelligible speech, sluggish and lethargic, does not follow commands ? ? ? ? ? ? ?Data Reviewed: ?I have Reviewed nursing notes, Vitals, and Lab results and prior records. Pertinent lab results creatinine 3.95, potassium 5.7, sodium 114, white blood cells 27.9, anion gap 36, lipase 185, pH 7.14, bicarb 4, PCO2 less than 18, lactate 2.8, INR normal, osmolality 357, beta hydroxybutyrate elevated COVID-negative  ?I have ordered test including repeated basic metabolic panel, beta hydroxybutyrate, and osmolality ?I have independently visualized and interpreted imaging chest x-ray which showed left base opacity. ?I have independently visualized  and interpreted EKG which showed EKG: . ?I have discussed pt's care plan and test results with sinus tachycardia With inferior T wave changes downsloping ST segment. ? ? ? ? ? ? ?Assessment and Plan: ?* Uncontrolled type 2 diabetes mellitus with ketoacidosis and coma, without long-term current use of insulin ([place]) ?Diabetes noted in chart as far back as 2018, partner says she has never seen him take medicine int he time she has known him (~71yr) and I see no record in notes of ever being prescribed  medicine. ? ?Here, he has severe DKA and diabetic coma. ? ?- 2 more liters IVF ?- Insulin infusion ?- q4hrs BMP ?- Check BHOB, A1c ?- Trend osmolality  ? ? ? ? ? ?Acute renal failure ([place]) ?Cr 3.9 from baseline in 2021 of 0.9.   ?- IV fluids and trend Cr ?- If no improvement with Cr, likely ATN but will need secondary work up ? ?Severe sepsis ([place]) ?Presented with hypothermia, tachycardia leukocytosis and LLL opacity with renal failure and encephalopathy.  Cannot rule out this is severe sepsis. ? ?- Continue Rocephin and azithromycin ?- Repeat lactate ?- Follow blood cultures ? ?Community acquired pneumonia of left lower lobe of lung ?See above ? ?Hyponatremia ?Corrects to ~130 ?- IV fluids and trend na ? ?Obesity (BMI 30-39.9) ?BMI 33 ? ?Essential hypertension ?Also chart history.  At present, BP normal ? ?Asthma, chronic ?This is a chart history, will need to confirm with patient ? ?Acute metabolic encephalopathy ?Due to DKA and/or sepsis.  With an osmolality that high, cerebral edema is a possible concern, although very unlikely. ?- Trend Osmolality ?- Neuro checks q4hs ? ?Hematemesis ?Reported hematemesis in ER with bright red streaking.   Probably Mallory-Weiss tear given the extent of vomiting per partner, low suspicion at present for esophageal varices or gastric ulcer. ?- Monitor ? ? ?Hyperkalemia ?- IV fluids every 4 hours BMP ? ? ? ? ? Advance Care Planning:   Code Status: Full Code  ? ?Consults: None at present, if worsening mental status, may consult critical care ? ?Family Communication: Girlfriend Dominic by phone ? ?Severity of Illness: ?The appropriate patient status for this patient is OBSERVATION. Observation status is judged to be reasonable and necessary in order to provide the required intensity of service to ensure the patient's safety. The patient's presenting symptoms, physical exam findings, and initial radiographic and laboratory data in the context of their medical condition is felt to  place them at decreased risk for further clinical deterioration. Furthermore, it is anticipated that the patient will be medically stable for discharge from the hospital within 2 midnights of admission.  ? ?Author: ?Edwin Dada, MD ?09/21/2021 5:02 PM ? ?For on call review [email or web address].  ?

## 2021-09-21 NOTE — Assessment & Plan Note (Addendum)
-   Chest x-ray obtained  showed bibasilar opacities ?-Patient has completed 5 days of Rocephin and Zithromax ?-Follow blood culture results ?

## 2021-09-21 NOTE — ED Triage Notes (Signed)
Pt presents via EMS from home for vomiting x3 days. Per EMS, pt family member reports increased pt weakness. Pt is a known diabetic and has a hx of asthma. Per EMS, pt family member is unaware what medications pt is supposed to be taking. Per EMS, pt family member states the pt has not had medications for several months. Hx limited. Hx provided by EMS.  ?

## 2021-09-21 NOTE — Progress Notes (Addendum)
Notified Lab that ABG being sent for analysis. 

## 2021-09-21 NOTE — ED Notes (Signed)
No change insulin drip at this time per Endo tool. Pt remains on 5units/hr. Will re-check at 1520.  ?

## 2021-09-22 ENCOUNTER — Observation Stay (HOSPITAL_COMMUNITY): Payer: Self-pay

## 2021-09-22 DIAGNOSIS — E1110 Type 2 diabetes mellitus with ketoacidosis without coma: Secondary | ICD-10-CM | POA: Diagnosis present

## 2021-09-22 DIAGNOSIS — E876 Hypokalemia: Secondary | ICD-10-CM

## 2021-09-22 LAB — CBC
HCT: 37 % — ABNORMAL LOW (ref 39.0–52.0)
Hemoglobin: 12.2 g/dL — ABNORMAL LOW (ref 13.0–17.0)
MCH: 22.7 pg — ABNORMAL LOW (ref 26.0–34.0)
MCHC: 33 g/dL (ref 30.0–36.0)
MCV: 68.9 fL — ABNORMAL LOW (ref 80.0–100.0)
Platelets: 403 10*3/uL — ABNORMAL HIGH (ref 150–400)
RBC: 5.37 MIL/uL (ref 4.22–5.81)
RDW: 14.3 % (ref 11.5–15.5)
WBC: 17.3 10*3/uL — ABNORMAL HIGH (ref 4.0–10.5)
nRBC: 0 % (ref 0.0–0.2)

## 2021-09-22 LAB — GLUCOSE, CAPILLARY
Glucose-Capillary: 198 mg/dL — ABNORMAL HIGH (ref 70–99)
Glucose-Capillary: 204 mg/dL — ABNORMAL HIGH (ref 70–99)
Glucose-Capillary: 218 mg/dL — ABNORMAL HIGH (ref 70–99)
Glucose-Capillary: 225 mg/dL — ABNORMAL HIGH (ref 70–99)
Glucose-Capillary: 229 mg/dL — ABNORMAL HIGH (ref 70–99)
Glucose-Capillary: 230 mg/dL — ABNORMAL HIGH (ref 70–99)
Glucose-Capillary: 232 mg/dL — ABNORMAL HIGH (ref 70–99)
Glucose-Capillary: 233 mg/dL — ABNORMAL HIGH (ref 70–99)
Glucose-Capillary: 234 mg/dL — ABNORMAL HIGH (ref 70–99)
Glucose-Capillary: 237 mg/dL — ABNORMAL HIGH (ref 70–99)
Glucose-Capillary: 237 mg/dL — ABNORMAL HIGH (ref 70–99)
Glucose-Capillary: 239 mg/dL — ABNORMAL HIGH (ref 70–99)
Glucose-Capillary: 242 mg/dL — ABNORMAL HIGH (ref 70–99)
Glucose-Capillary: 258 mg/dL — ABNORMAL HIGH (ref 70–99)
Glucose-Capillary: 259 mg/dL — ABNORMAL HIGH (ref 70–99)
Glucose-Capillary: 260 mg/dL — ABNORMAL HIGH (ref 70–99)
Glucose-Capillary: 261 mg/dL — ABNORMAL HIGH (ref 70–99)
Glucose-Capillary: 263 mg/dL — ABNORMAL HIGH (ref 70–99)
Glucose-Capillary: 264 mg/dL — ABNORMAL HIGH (ref 70–99)
Glucose-Capillary: 265 mg/dL — ABNORMAL HIGH (ref 70–99)
Glucose-Capillary: 266 mg/dL — ABNORMAL HIGH (ref 70–99)
Glucose-Capillary: 279 mg/dL — ABNORMAL HIGH (ref 70–99)
Glucose-Capillary: 315 mg/dL — ABNORMAL HIGH (ref 70–99)

## 2021-09-22 LAB — BASIC METABOLIC PANEL
Anion gap: 24 — ABNORMAL HIGH (ref 5–15)
Anion gap: 23 — ABNORMAL HIGH (ref 5–15)
Anion gap: 19 — ABNORMAL HIGH (ref 5–15)
Anion gap: 15 (ref 5–15)
Anion gap: 14 (ref 5–15)
Anion gap: 15 (ref 5–15)
BUN: 57 mg/dL — ABNORMAL HIGH (ref 6–20)
BUN: 55 mg/dL — ABNORMAL HIGH (ref 6–20)
BUN: 50 mg/dL — ABNORMAL HIGH (ref 6–20)
BUN: 43 mg/dL — ABNORMAL HIGH (ref 6–20)
BUN: 38 mg/dL — ABNORMAL HIGH (ref 6–20)
BUN: 34 mg/dL — ABNORMAL HIGH (ref 6–20)
CO2: 10 mmol/L — ABNORMAL LOW (ref 22–32)
CO2: 12 mmol/L — ABNORMAL LOW (ref 22–32)
CO2: 16 mmol/L — ABNORMAL LOW (ref 22–32)
CO2: 18 mmol/L — ABNORMAL LOW (ref 22–32)
CO2: 18 mmol/L — ABNORMAL LOW (ref 22–32)
CO2: 17 mmol/L — ABNORMAL LOW (ref 22–32)
Calcium: 8.4 mg/dL — ABNORMAL LOW (ref 8.9–10.3)
Calcium: 8.5 mg/dL — ABNORMAL LOW (ref 8.9–10.3)
Calcium: 8.7 mg/dL — ABNORMAL LOW (ref 8.9–10.3)
Calcium: 8.8 mg/dL — ABNORMAL LOW (ref 8.9–10.3)
Calcium: 9.2 mg/dL (ref 8.9–10.3)
Calcium: 8.9 mg/dL (ref 8.9–10.3)
Chloride: 94 mmol/L — ABNORMAL LOW (ref 98–111)
Chloride: 96 mmol/L — ABNORMAL LOW (ref 98–111)
Chloride: 97 mmol/L — ABNORMAL LOW (ref 98–111)
Chloride: 99 mmol/L (ref 98–111)
Chloride: 103 mmol/L (ref 98–111)
Chloride: 101 mmol/L (ref 98–111)
Creatinine, Ser: 2.48 mg/dL — ABNORMAL HIGH (ref 0.61–1.24)
Creatinine, Ser: 2.25 mg/dL — ABNORMAL HIGH (ref 0.61–1.24)
Creatinine, Ser: 2.14 mg/dL — ABNORMAL HIGH (ref 0.61–1.24)
Creatinine, Ser: 1.76 mg/dL — ABNORMAL HIGH (ref 0.61–1.24)
Creatinine, Ser: 1.83 mg/dL — ABNORMAL HIGH (ref 0.61–1.24)
Creatinine, Ser: 1.45 mg/dL — ABNORMAL HIGH (ref 0.61–1.24)
GFR, Estimated: 35 mL/min — ABNORMAL LOW (ref >60)
GFR, Estimated: 39 mL/min — ABNORMAL LOW (ref >60)
GFR, Estimated: 42 mL/min — ABNORMAL LOW (ref >60)
GFR, Estimated: 53 mL/min — ABNORMAL LOW (ref >60)
GFR, Estimated: 51 mL/min — ABNORMAL LOW (ref >60)
GFR, Estimated: >60 mL/min (ref >60)
Glucose, Bld: 369 mg/dL — ABNORMAL HIGH (ref 70–99)
Glucose, Bld: 258 mg/dL — ABNORMAL HIGH (ref 70–99)
Glucose, Bld: 263 mg/dL — ABNORMAL HIGH (ref 70–99)
Glucose, Bld: 273 mg/dL — ABNORMAL HIGH (ref 70–99)
Glucose, Bld: 230 mg/dL — ABNORMAL HIGH (ref 70–99)
Glucose, Bld: 266 mg/dL — ABNORMAL HIGH (ref 70–99)
Potassium: 3.6 mmol/L (ref 3.5–5.1)
Potassium: 3.5 mmol/L (ref 3.5–5.1)
Potassium: 3.1 mmol/L — ABNORMAL LOW (ref 3.5–5.1)
Potassium: 3.2 mmol/L — ABNORMAL LOW (ref 3.5–5.1)
Potassium: 3.3 mmol/L — ABNORMAL LOW (ref 3.5–5.1)
Potassium: 3.1 mmol/L — ABNORMAL LOW (ref 3.5–5.1)
Sodium: 128 mmol/L — ABNORMAL LOW (ref 135–145)
Sodium: 131 mmol/L — ABNORMAL LOW (ref 135–145)
Sodium: 132 mmol/L — ABNORMAL LOW (ref 135–145)
Sodium: 132 mmol/L — ABNORMAL LOW (ref 135–145)
Sodium: 135 mmol/L (ref 135–145)
Sodium: 133 mmol/L — ABNORMAL LOW (ref 135–145)

## 2021-09-22 LAB — BETA-HYDROXYBUTYRIC ACID
Beta-Hydroxybutyric Acid: 5.89 mmol/L — ABNORMAL HIGH (ref 0.05–0.27)
Beta-Hydroxybutyric Acid: 5.25 mmol/L — ABNORMAL HIGH (ref 0.05–0.27)

## 2021-09-22 LAB — OSMOLALITY
Osmolality: 331 mOsm/kg (ref 275–295)
Osmolality: 324 mOsm/kg (ref 275–295)

## 2021-09-22 LAB — HEMOGLOBIN A1C
Hgb A1c MFr Bld: 13.8 % — ABNORMAL HIGH (ref 4.8–5.6)
Mean Plasma Glucose: 349 mg/dL

## 2021-09-22 LAB — CK: Total CK: 697 U/L — ABNORMAL HIGH (ref 49–397)

## 2021-09-22 MED ORDER — PANTOPRAZOLE SODIUM 40 MG IV SOLR
40.0000 mg | Freq: Two times a day (BID) | INTRAVENOUS | Status: DC
  Administered 2021-09-22 – 2021-09-26 (×9): 40 mg via INTRAVENOUS
  Filled 2021-09-22 (×10): qty 10

## 2021-09-22 MED ORDER — INSULIN STARTER KIT- PEN NEEDLES (ENGLISH)
1.0000 | Freq: Once | Status: DC
  Filled 2021-09-22 (×3): qty 1

## 2021-09-22 MED ORDER — LIVING WELL WITH DIABETES BOOK
Freq: Once | Status: AC
  Filled 2021-09-22: qty 1

## 2021-09-22 MED ORDER — POTASSIUM CHLORIDE 10 MEQ/100ML IV SOLN
10.0000 meq | INTRAVENOUS | Status: AC
  Administered 2021-09-22 (×3): 10 meq via INTRAVENOUS
  Filled 2021-09-22 (×3): qty 100

## 2021-09-22 MED ORDER — POTASSIUM CHLORIDE 10 MEQ/100ML IV SOLN
10.0000 meq | INTRAVENOUS | Status: AC
  Administered 2021-09-22 (×2): 10 meq via INTRAVENOUS
  Filled 2021-09-22 (×2): qty 100

## 2021-09-22 MED ORDER — HYDRALAZINE HCL 25 MG PO TABS: 25.0000 mg | ORAL_TABLET | Freq: Four times a day (QID) | ORAL | Status: DC | PRN

## 2021-09-22 MED ORDER — HYDRALAZINE HCL 20 MG/ML IJ SOLN
10.0000 mg | INTRAMUSCULAR | Status: DC | PRN
  Administered 2021-09-22 – 2021-09-25 (×10): 10 mg via INTRAVENOUS
  Filled 2021-09-22 (×10): qty 1

## 2021-09-22 NOTE — Progress Notes (Signed)
SLP Cancellation Note ? ?Patient Details ?Name: Brent Blake ?MRN: [id number] ?DOB: [birth year] ? ? ?Cancelled treatment:       Reason Eval/Treat Not Completed: Patient at procedure or test/unavailable ? ?SLP unable to complete clinical swallow evaluation at bedside, as pt is currently with nursing. Pt is currently NPO per chart review. Will continue efforts.  ? ?Brent Blake, MSP, CCC-SLP ?Speech Language Pathologist ?Office: [phone] ? ?Brent Blake ?09/22/2021, 10:02 AM ?

## 2021-09-22 NOTE — Evaluation (Signed)
Clinical/Bedside Swallow Evaluation ?Patient Details  ?Name: Brent Blake ?MRN: [id number] ?Date of Birth: April 21, 1991 ? ?Today's Date: 09/22/2021 ?Time: SLP Start Time (ACUTE ONLY): 1115 SLP Stop Time (ACUTE ONLY): 1145 ?SLP Time Calculation (min) (ACUTE ONLY): 30 min ? ?Past Medical History:  ?Past Medical History:  ?Diagnosis Date  ? Back pain   ? Diabetes mellitus without complication ([facility])   ? Hypertension   ? Seasonal allergies   ? ?Past Surgical History:  ?Past Surgical History:  ?Procedure Laterality Date  ? DENTAL SURGERY    ? ?HPI:  ?31yo male admitted 09/21/21 with lethargy, vomiting, cough, confusion related to hyperglycemia. PMH: DM2, asthma, smoking, HTN, obesity, marijuana, etoh. CXR - LLL opacity.  ?  ?Assessment / Plan / Recommendation  ?Clinical Impression ? Pt seen at bedside for assessment of swallow function and safety. Pt was initially lethargic, and was slow to fully arouse. Voice quality was hoarse and very low in intensity. Pt reports this change in his voice occured "a couple days" prior to admit. Pt has adequate natural dentition, and his CN exam is unremarkable. Pt accepted trials of ice chips, thin liquid, and puree. Timely oral prep and adequate oral clearing noted. Changes in breath sounds noted on all consistencies given, with congestion and coughing up of material presented. Pt reports the difficulty with swallowing also started a couple days prior to admit. At this time, no PO diet is recommended, based on bedside presentation indicating high risk of aspiration of the continuum of consistencies. Completion of instrumental study is not recommended yet, as bedside presentation is highly suggestive of aspiration of all textures. SLP will continue to follow to assess PO readiness. Recommend consideration of ENT evaluation if voice quality does not return to normal in a timely fashion. RN and MD informed. ?SLP Visit Diagnosis: Dysphagia, unspecified (R13.10) ?   ?Aspiration Risk ? Severe  aspiration risk  ?  ?Diet Recommendation NPO  ? ?Medication Administration: Via alternative means  ?  ?Other  Recommendations Oral Care Recommendations: Oral care prior to ice chip/H20;Oral care QID ?Other Recommendations: Have oral suction available   ? ?Recommendations for follow up therapy are one component of a multi-disciplinary discharge planning process, led by the attending physician.  Recommendations may be updated based on patient status, additional functional criteria and insurance authorization. ? ?Follow up Recommendations Other (comment) (TBD)  ? ? ?  ?Assistance Recommended at Discharge Other (comment) (TBD)  ?Functional Status Assessment Patient has had a recent decline in their functional status and demonstrates the ability to make significant improvements in function in a reasonable and predictable amount of time.  ?Frequency and Duration min 1 x/week  ?1 week;2 weeks ?  ?   ? ?Prognosis Prognosis for Safe Diet Advancement: Good  ? ?  ? ?Swallow Study   ?General Date of Onset: 09/21/21 ?HPI: 31yo male admitted 09/21/21 with lethargy, vomiting, cough, confusion related to hyperglycemia. PMH: DM2, asthma, smoking, HTN, obesity, marijuana, etoh. CXR - LLL opacity. ?Type of Study: Bedside Swallow Evaluation ?Previous Swallow Assessment: none ?Diet Prior to this Study: NPO ?Temperature Spikes Noted: No ?Respiratory Status: Room air ?History of Recent Intubation: No ?Behavior/Cognition: Alert;Cooperative ?Oral Cavity Assessment: Within Functional Limits ?Oral Care Completed by SLP: No ?Oral Cavity - Dentition: Adequate natural dentition ?Vision: Functional for self-feeding ?Self-Feeding Abilities: Able to feed self ?Patient Positioning: Upright in bed ?Baseline Vocal Quality: Hoarse;Low vocal intensity ?Volitional Cough: Weak ?Volitional Swallow: Able to elicit  ?  ?Oral/Motor/Sensory Function Overall Oral Motor/Sensory Function: Within  functional limits   ?Ice Chips Ice chips: Impaired ?Pharyngeal Phase  Impairments: Throat Clearing - Immediate;Cough - Delayed;Suspected delayed Swallow   ?Thin Liquid Thin Liquid: Impaired ?Presentation: Straw ?Pharyngeal  Phase Impairments: Throat Clearing - Immediate;Cough - Delayed;Suspected delayed Swallow  ?  ?Nectar Thick Nectar Thick Liquid: Not tested   ?Honey Thick Honey Thick Liquid: Not tested   ?Puree Puree: Impaired ?Presentation: Spoon ?Pharyngeal Phase Impairments: Throat Clearing - Immediate;Cough - Delayed   ?Solid ? ? ?  Solid: Not tested  ? ?  ?Tinzley Dalia B. Geno Sydnor, MSP, CCC-SLP ?Speech Language Pathologist ?Office: [phone] ? ?Leigh Aurora ?09/22/2021,11:46 AM ? ? ? ?

## 2021-09-22 NOTE — Progress Notes (Addendum)
Inpatient Diabetes Program Recommendations ? ?AACE/ADA: New Consensus Statement on Inpatient Glycemic Control (2015) ? ?Target Ranges:  Prepandial:   less than 140 mg/dL ?     Peak postprandial:   less than 180 mg/dL (1-2 hours) ?     Critically ill patients:  140 - 180 mg/dL  ? ?Lab Results  ?Component Value Date  ? GLUCAP 259 (H) 09/22/2021  ? HGBA1C 13.8 (H) 09/21/2021  ? ? ?Review of Glycemic Control ? ?Diabetes history: DM2 ?Outpatient Diabetes medications: None ?Current orders for Inpatient glycemic control: IV insulin ? ?HgbA1C - 13.8% ?CO2 still low at 18, AG 15 ?Awaiting Beta-hydroxybutyric Acid results ? ? ? ?Inpatient Diabetes Program Recommendations:   ? ?When criteria met for discontinuation of drip: ? ?Semglee 10 units BID (give 2H prior to discontinuation of drip) ?Novolog 0-15 units Q4H  ? ?Attempted to speak with pt at bedside about his diabetes history PTA. Pt was extremely groggy and could not keep eyes open. Spoke with RN about pt's response.  ? ?Will try to see pt on 3/16 for diabetes education. ? ?Continue to follow. ? ?Thank you. ?Lorenda Peck, RD, LDN, CDE ?Inpatient Diabetes Coordinator ?[phone]  ? ? ? ? ? ?

## 2021-09-22 NOTE — Assessment & Plan Note (Addendum)
-   Potassium is 3.2 despite replacement ?-We will replace potassium and follow BMP in am ?-We will recheck serum magnesium today ? ?

## 2021-09-22 NOTE — Progress Notes (Signed)
[facility] ? ?PROGRESS NOTE ? ?[name] WHQ:[id number] DOB: 1991/02/06 DOA: 09/21/2021 ?PCP: Riley Churches, MD ? ?Brief hospital course ? ?[name] is a 31 y.o. M with T2DM never on treatment, smoking, and HTN and obesity who presented with lethargy, vomiting, as well as cough, confusion.' ? ?All history collected from partner at bedside.  Evidently, few days ago, was coughing real bad, vomiting all over the house.  Partner took him to her house, was trying to treat him there, but eventually realized and told him this was "Not no regular cold, you got to go the hospital."  Unfortunately, by htat time he was confused and couldn't even stand, so EMS were activated.   ? ?She thinks he has a history of diabetes and hypertension.  I also see albuterol listed in his prior meds.    ? ?In the ER, Blood sugar >1000, anion gap 36, Bicarb 7, VBG showed pH 7.1.  He appeared lethargic and unresponsive.  CXR had a left base opacity.  He had reported hematemesis.  Was given 2L fluids, started on insulin drip and antibiotics. ? ? ?  ? ? ? ?Subjective  ? ?Patient seen and examined, continues to be lethargic.  Still getting IV insulin for DKA ? ?  ? ?Assessment and Plan: ? ?* Uncontrolled type 2 diabetes mellitus with ketoacidosis and coma, without long-term current use of insulin ([place]) ?Diabetes noted in chart as far back as 2018, partner says she has never seen him take medicine int he time she has known him (~56yr and I see no record in notes of ever being prescribed medicine. ? ?Here, he has severe DKA and diabetic coma. ?-DKA is improving ?-Mental status has improved ?-Continue IV insulin, CO2 is 18 ?-Follow BMP every 4 hours ?-Beta-hydroxybutyrate is 5.25 this morning ? ? ? ? ? ? ?[place] renal failure ([place] ?Cr 3.9 from baseline in 2021 of 0.9.   ?- Started on IV fluids ?-Creatinine has improved to 1.76 ? ?Severe sepsis ([place] ?Presented with hypothermia, tachycardia leukocytosis and LLL opacity with renal failure  and encephalopathy.  Cannot rule out this is severe sepsis. ? ?- Continue Rocephin and azithromycin ?- Repeat lactate is 1.5 ?- Follow blood cultures ? ?Community acquired pneumonia of left lower lobe of lung ?- Chest x-ray obtained today shows bibasilar opacities ?-Patient is currently on Rocephin and Zithromax ?-Follow blood culture results ? ?Hypokalemia ?- Potassium is 3.2 ?-Replace potassium and follow BMP in am ? ?Hyponatremia ?- Sodium improved to 132 ? ?Obesity (BMI 30-39.9) ?BMI 33 ? ?Essential hypertension ?Also chart history.  At present, BP normal ? ?Asthma, chronic ?This is a chart history, will need to confirm with patient ? ?Acute metabolic encephalopathy ?Due to DKA and/or sepsis.   ?-Serum osmolality is high at 324 ?-We will repeat serum muscularity ?- Neuro checks q4hs ? ?Hematemesis ?Reported hematemesis in ER with bright red streaking.   Probably Mallory-Weiss tear given the extent of vomiting per partner, low suspicion at present for esophageal varices or gastric ulcer. ?- Monitor ? ? ? ? ? ?  ? ?Medications ? ?  ? Chlorhexidine Gluconate Cloth  6 each Topical Daily  ? enoxaparin (LOVENOX) injection  40 mg Subcutaneous Q24H  ? insulin starter kit- pen needles  1 kit Other Once  ? pantoprazole (PROTONIX) IV  40 mg Intravenous Q12H  ? ? ? Data Reviewed:  ? ?CBG: ? ?Recent Labs  ?Lab 09/22/21 ?1008 09/22/21 ?1104 09/22/21 ?1222 09/22/21 ?1303 09/22/21 ?1421  ?  GLUCAP 232* 233* 258* 260* 259*  ? ? ?SpO2: 97 %  ? ? ?Vitals:  ? 09/22/21 1140 09/22/21 1200 09/22/21 1300 09/22/21 1400  ?BP:  (!) 153/98 (!) 118/37 (!) 147/66  ?Pulse:  (!) 127 (!) 128 (!) 123  ?Resp:  (!) 26 13 (!) 26  ?Temp: 98.6 ?F (37 ?C)     ?TempSrc: Axillary     ?SpO2:  97% 99% 97%  ?Weight:      ?Height:      ? ? ? ? ?Data Reviewed: ? ?Basic Metabolic Panel: ?Recent Labs  ?Lab 09/21/21 ?2030 09/22/21 ?0012 09/22/21 ?6720 09/22/21 ?9470 09/22/21 ?1244  ?NA 122* 128* 131* 132* 132*  ?K 4.2 3.6 3.5 3.1* 3.2*  ?CL 88* 94* 96* 97* 99   ?CO2 9* 10* 12* 16* 18*  ?GLUCOSE 557* 369* 258* 263* 273*  ?BUN 64* 57* 55* 50* 43*  ?CREATININE 3.16* 2.48* 2.25* 2.14* 1.76*  ?CALCIUM 8.0* 8.4* 8.5* 8.7* 8.8*  ? ? ?CBC: ?Recent Labs  ?Lab 09/21/21 ?1224 09/21/21 ?1236 09/22/21 ?0700  ?WBC 27.9*  --  17.3*  ?NEUTROABS 23.6*  --   --   ?HGB 13.6 15.0 12.2*  ?HCT 42.6 44.0 37.0*  ?MCV 71.0*  --  68.9*  ?PLT 488*  --  403*  ? ? ?LFT ?Recent Labs  ?Lab 09/21/21 ?1224  ?AST 22  ?ALT 40  ?ALKPHOS 99  ?BILITOT 1.0  ?PROT 7.9  ?ALBUMIN 4.0  ? ?  ?Micro : Blood cultures x2 obtained, result pending ? ? ? ?Antibiotics: ?Anti-infectives (From admission, onward)  ? ? Start     Dose/Rate Route Frequency Ordered Stop  ? 09/21/21 2200  cefTRIAXone (ROCEPHIN) 2 g in sodium chloride 0.9 % 100 mL IVPB       ? 2 g ?200 mL/hr over 30 Minutes Intravenous Every 24 hours 09/21/21 1549 09/26/21 2159  ? 09/21/21 2200  azithromycin (ZITHROMAX) 500 mg in sodium chloride 0.9 % 250 mL IVPB       ? 500 mg ?250 mL/hr over 60 Minutes Intravenous Every 24 hours 09/21/21 1549 09/26/21 2159  ? 09/21/21 1600  vancomycin (VANCOREADY) IVPB 500 mg/100 mL  Status:  Discontinued       ?See Hyperspace for full Linked Orders Report.  ? 500 mg ?100 mL/hr over 60 Minutes Intravenous  Once 09/21/21 1344 09/21/21 1549  ? 09/21/21 1400  vancomycin (VANCOREADY) IVPB 2000 mg/400 mL       ?See Hyperspace for full Linked Orders Report.  ? 2,000 mg ?200 mL/hr over 120 Minutes Intravenous  Once 09/21/21 1344 09/21/21 1748  ? 09/21/21 1330  ceFEPIme (MAXIPIME) 2 g in sodium chloride 0.9 % 100 mL IVPB       ? 2 g ?200 mL/hr over 30 Minutes Intravenous  Once 09/21/21 1328 09/21/21 1447  ? 09/21/21 1330  metroNIDAZOLE (FLAGYL) IVPB 500 mg  Status:  Discontinued       ? 500 mg ?100 mL/hr over 60 Minutes Intravenous  Once 09/21/21 1328 09/21/21 1707  ? 09/21/21 1330  vancomycin (VANCOCIN) IVPB 1000 mg/200 mL premix  Status:  Discontinued       ? 1,000 mg ?200 mL/hr over 60 Minutes Intravenous  Once 09/21/21 1328 09/21/21  1344  ? ?  ? ? ? ? ? ?DVT prophylaxis: Lovenox ? ?Code Status: Full code ? ?Family Communication: No family at bedside ? ?CONSULTS: ? ? ? ?Objective  ? ? ?Physical Examination: ? ? ?General-appears in no acute distress ?Heart-S1-S2, regular, no  murmur auscultated ?Lungs-clear to auscultation bilaterally, no wheezing or crackles auscultated ?Abdomen-soft, nontender, no organomegaly ?Extremities-no edema in the lower extremities ?Neuro-lethargic, opens eyes to verbal stimuli, following commands ? ? ?Status is: Inpatient:   Diabetic ketoacidosis ? ? ? ?  ? ?Oswald Hillock ?  ?[facility] ?If 7PM-7AM, please contact night-coverage at [email or web address], ?Office  [phone] ? ? ?09/22/2021, 2:54 PM  LOS: 0 days  ? ? ? ? ? ? ? ? ? ? ?  ?

## 2021-09-23 ENCOUNTER — Inpatient Hospital Stay (HOSPITAL_COMMUNITY): Payer: Self-pay

## 2021-09-23 ENCOUNTER — Other Ambulatory Visit: Payer: Self-pay

## 2021-09-23 DIAGNOSIS — R1310 Dysphagia, unspecified: Secondary | ICD-10-CM

## 2021-09-23 LAB — GLUCOSE, CAPILLARY
Glucose-Capillary: 217 mg/dL — ABNORMAL HIGH (ref 70–99)
Glucose-Capillary: 240 mg/dL — ABNORMAL HIGH (ref 70–99)
Glucose-Capillary: 210 mg/dL — ABNORMAL HIGH (ref 70–99)
Glucose-Capillary: 256 mg/dL — ABNORMAL HIGH (ref 70–99)
Glucose-Capillary: 224 mg/dL — ABNORMAL HIGH (ref 70–99)
Glucose-Capillary: 222 mg/dL — ABNORMAL HIGH (ref 70–99)
Glucose-Capillary: 214 mg/dL — ABNORMAL HIGH (ref 70–99)
Glucose-Capillary: 215 mg/dL — ABNORMAL HIGH (ref 70–99)
Glucose-Capillary: 229 mg/dL — ABNORMAL HIGH (ref 70–99)
Glucose-Capillary: 175 mg/dL — ABNORMAL HIGH (ref 70–99)
Glucose-Capillary: 230 mg/dL — ABNORMAL HIGH (ref 70–99)
Glucose-Capillary: 229 mg/dL — ABNORMAL HIGH (ref 70–99)
Glucose-Capillary: 207 mg/dL — ABNORMAL HIGH (ref 70–99)
Glucose-Capillary: 211 mg/dL — ABNORMAL HIGH (ref 70–99)
Glucose-Capillary: 242 mg/dL — ABNORMAL HIGH (ref 70–99)
Glucose-Capillary: 174 mg/dL — ABNORMAL HIGH (ref 70–99)
Glucose-Capillary: 188 mg/dL — ABNORMAL HIGH (ref 70–99)
Glucose-Capillary: 177 mg/dL — ABNORMAL HIGH (ref 70–99)
Glucose-Capillary: 181 mg/dL — ABNORMAL HIGH (ref 70–99)
Glucose-Capillary: 216 mg/dL — ABNORMAL HIGH (ref 70–99)
Glucose-Capillary: 194 mg/dL — ABNORMAL HIGH (ref 70–99)
Glucose-Capillary: 192 mg/dL — ABNORMAL HIGH (ref 70–99)

## 2021-09-23 LAB — BASIC METABOLIC PANEL
Anion gap: 16 — ABNORMAL HIGH (ref 5–15)
Anion gap: 12 (ref 5–15)
Anion gap: 10 (ref 5–15)
Anion gap: 7 (ref 5–15)
BUN: 28 mg/dL — ABNORMAL HIGH (ref 6–20)
BUN: 24 mg/dL — ABNORMAL HIGH (ref 6–20)
BUN: 17 mg/dL (ref 6–20)
BUN: 13 mg/dL (ref 6–20)
CO2: 16 mmol/L — ABNORMAL LOW (ref 22–32)
CO2: 19 mmol/L — ABNORMAL LOW (ref 22–32)
CO2: 24 mmol/L (ref 22–32)
CO2: 23 mmol/L (ref 22–32)
Calcium: 9.2 mg/dL (ref 8.9–10.3)
Calcium: 9.2 mg/dL (ref 8.9–10.3)
Calcium: 8.8 mg/dL — ABNORMAL LOW (ref 8.9–10.3)
Calcium: 8.8 mg/dL — ABNORMAL LOW (ref 8.9–10.3)
Chloride: 102 mmol/L (ref 98–111)
Chloride: 105 mmol/L (ref 98–111)
Chloride: 102 mmol/L (ref 98–111)
Chloride: 105 mmol/L (ref 98–111)
Creatinine, Ser: 1.35 mg/dL — ABNORMAL HIGH (ref 0.61–1.24)
Creatinine, Ser: 1.1 mg/dL (ref 0.61–1.24)
Creatinine, Ser: 1.04 mg/dL (ref 0.61–1.24)
Creatinine, Ser: 0.82 mg/dL (ref 0.61–1.24)
GFR, Estimated: >60 mL/min (ref >60)
GFR, Estimated: >60 mL/min (ref >60)
GFR, Estimated: >60 mL/min (ref >60)
GFR, Estimated: >60 mL/min (ref >60)
Glucose, Bld: 256 mg/dL — ABNORMAL HIGH (ref 70–99)
Glucose, Bld: 233 mg/dL — ABNORMAL HIGH (ref 70–99)
Glucose, Bld: 216 mg/dL — ABNORMAL HIGH (ref 70–99)
Glucose, Bld: 197 mg/dL — ABNORMAL HIGH (ref 70–99)
Potassium: 3 mmol/L — ABNORMAL LOW (ref 3.5–5.1)
Potassium: 3 mmol/L — ABNORMAL LOW (ref 3.5–5.1)
Potassium: 2.8 mmol/L — ABNORMAL LOW (ref 3.5–5.1)
Potassium: 2.8 mmol/L — ABNORMAL LOW (ref 3.5–5.1)
Sodium: 134 mmol/L — ABNORMAL LOW (ref 135–145)
Sodium: 136 mmol/L (ref 135–145)
Sodium: 136 mmol/L (ref 135–145)
Sodium: 135 mmol/L (ref 135–145)

## 2021-09-23 LAB — BETA-HYDROXYBUTYRIC ACID
Beta-Hydroxybutyric Acid: 4.16 mmol/L — ABNORMAL HIGH (ref 0.05–0.27)
Beta-Hydroxybutyric Acid: 0.42 mmol/L — ABNORMAL HIGH (ref 0.05–0.27)

## 2021-09-23 LAB — MAGNESIUM
Magnesium: 2.9 mg/dL — ABNORMAL HIGH (ref 1.7–2.4)
Magnesium: 2.4 mg/dL (ref 1.7–2.4)

## 2021-09-23 LAB — OSMOLALITY: Osmolality: 301 mOsm/kg — ABNORMAL HIGH (ref 275–295)

## 2021-09-23 MED ORDER — PHENYLEPHRINE 40 MCG/ML (10ML) SYRINGE FOR IV PUSH (FOR BLOOD PRESSURE SUPPORT)
PREFILLED_SYRINGE | INTRAVENOUS | Status: AC
  Filled 2021-09-23: qty 10

## 2021-09-23 MED ORDER — IOHEXOL 300 MG/ML  SOLN
100.0000 mL | Freq: Once | INTRAMUSCULAR | Status: AC | PRN
  Administered 2021-09-23: 100 mL via INTRAVENOUS

## 2021-09-23 MED ORDER — POTASSIUM CHLORIDE 10 MEQ/100ML IV SOLN
10.0000 meq | INTRAVENOUS | Status: AC
  Administered 2021-09-23 (×3): 10 meq via INTRAVENOUS
  Filled 2021-09-23: qty 100

## 2021-09-23 MED ORDER — POTASSIUM CHLORIDE 10 MEQ/100ML IV SOLN
10.0000 meq | INTRAVENOUS | Status: AC
  Administered 2021-09-23 (×5): 10 meq via INTRAVENOUS
  Filled 2021-09-23 (×4): qty 100

## 2021-09-23 MED ORDER — SODIUM CHLORIDE (PF) 0.9 % IJ SOLN
INTRAMUSCULAR | Status: AC
  Filled 2021-09-23: qty 50

## 2021-09-23 NOTE — Assessment & Plan Note (Addendum)
-   Patient was not swallowing due to mental status change/DKA ?-Swallow evaluation obtained, initially he was kept n.p.o. ?-After mental status improved patient is able to swallow well ?-Started on regular diet ?

## 2021-09-23 NOTE — Progress Notes (Addendum)
[facility] ? ?PROGRESS NOTE ? ?TRAN ARZUAGA LTJ:[id number] DOB: 22-Jun-1991 DOA: 09/21/2021 ?PCP: Riley Churches, MD ? ?Brief hospital course ? ?[name] is a 31 y.o. M with T2DM never on treatment, smoking, and HTN and obesity who presented with lethargy, vomiting, as well as cough, confusion.' ? ?All history collected from partner at bedside.  Evidently, few days ago, was coughing real bad, vomiting all over the house.  Partner took him to her house, was trying to treat him there, but eventually realized and told him this was "Not no regular cold, you got to go the hospital."  Unfortunately, by htat time he was confused and couldn't even stand, so EMS were activated.   ? ?She thinks he has a history of diabetes and hypertension.  I also see albuterol listed in his prior meds.    ? ?In the ER, Blood sugar >1000, anion gap 36, Bicarb 7, VBG showed pH 7.1.  He appeared lethargic and unresponsive.  CXR had a left base opacity.  He had reported hematemesis.  Was given 2L fluids, started on insulin drip and antibiotics. ? ? ?  ? ? ? ?Subjective  ? ?Patient seen and examined, continues to be  lethargic.  As per patient's girlfriend, he has been vomiting for past 2 days and also had episode of bloody vomitus. ? ?  ? ?Assessment and Plan: ? ?* Uncontrolled type 2 diabetes mellitus with ketoacidosis and coma, without long-term current use of insulin ([place]) ?Diabetes noted in chart as far back as 2018, partner says she has never seen him take medicine int he time she has known him (~54yr and I see no record in notes of ever being prescribed medicine. ? ?Here, he has severe DKA and diabetic coma. ?-DKA is improving ?-Mental status has improved ?-Continue IV insulin, CO2 is 19 ?-We will check BMP today ? ? ? ? ? ? ?Acute renal failure ([place] ?Cr 3.9 from baseline in 2021 of 0.9.   ?- Started on IV fluids ?-Creatinine has improved to 1.10 ? ?Severe sepsis ([place] ?Presented with hypothermia, tachycardia leukocytosis and  LLL opacity with renal failure and encephalopathy.  Cannot rule out this is severe sepsis. ? ?- Continue Rocephin and azithromycin ?- Repeat lactate is 1.5 ?- Follow blood cultures ? ?Community acquired pneumonia of left lower lobe of lung ?- Chest x-ray obtained  showed bibasilar opacities ?-Patient is currently on Rocephin and Zithromax ?-Follow blood culture results ? ?Dysphagia ?- Patient not swallowing due to mental status change/DKA ?-Swallow evaluation obtained, patient is currently n.p.o. ?-High aspiration risk ? ?Hypokalemia ?- Potassium is 3.0 ?-Replace potassium and follow BMP in am ? ?Hyponatremia ?- Sodium improved to 132 ? ?Obesity (BMI 30-39.9) ?BMI 33 ? ?Essential hypertension ?Also chart history.  At present, BP normal ? ?Asthma, chronic ?This is a chart history, will need to confirm with patient ? ?Acute metabolic encephalopathy ?Due to DKA and/or sepsis.   ?-Serum osmolality is high at 324 ?-We will repeat serum osmolality ?- Neuro checks q4hs ? ?Hematemesis ?Reported hematemesis in ER with bright red streaking.   Probably Mallory-Weiss tear given the extent of vomiting per partner, low suspicion at present for esophageal varices or gastric ulcer. ?- Obtained CT chest/CT neck today ?-Only shows thickening of esophagus consistent with esophagitis ? ? ? ? ? ?  ? ?Medications ? ?  ? Chlorhexidine Gluconate Cloth  6 each Topical Daily  ? enoxaparin (LOVENOX) injection  40 mg Subcutaneous Q24H  ? insulin starter kit-  pen needles  1 kit Other Once  ? pantoprazole (PROTONIX) IV  40 mg Intravenous Q12H  ? phenylephrine      ? sodium chloride (PF)      ? ? ? Data Reviewed:  ? ?CBG: ? ?Recent Labs  ?Lab 09/23/21 ?0735 09/23/21 ?0102 09/23/21 ?1006 09/23/21 ?1101 09/23/21 ?1228  ?GLUCAP 215* 229* 230* 175* 229*  ? ? ?SpO2: 97 %  ? ? ?Vitals:  ? 09/23/21 1100 09/23/21 1133 09/23/21 1200 09/23/21 1300  ?BP: (!) 172/111  (!) 197/99 (!) 166/97  ?Pulse: (!) 121  (!) 109 (!) 113  ?Resp: (!) 25  19 (!) 21  ?Temp:   98.6 ?F (37 ?C)    ?TempSrc:  Axillary    ?SpO2: 100%  91% 97%  ?Weight:      ?Height:      ? ? ? ? ?Data Reviewed: ? ?Basic Metabolic Panel: ?Recent Labs  ?Lab 09/22/21 ?1244 09/22/21 ?1612 09/22/21 ?2010 09/23/21 ?7253 09/23/21 ?6644  ?NA 132* 135 133* 134* 136  ?K 3.2* 3.3* 3.1* 3.0* 3.0*  ?CL 99 103 101 102 105  ?CO2 18* 18* 17* 16* 19*  ?GLUCOSE 273* 230* 266* 256* 233*  ?BUN 43* 38* 34* 28* 24*  ?CREATININE 1.76* 1.83* 1.45* 1.35* 1.10  ?CALCIUM 8.8* 9.2 8.9 9.2 9.2  ?MG  --   --   --  2.9*  --   ? ? ?CBC: ?Recent Labs  ?Lab 09/21/21 ?1224 09/21/21 ?1236 09/22/21 ?0700  ?WBC 27.9*  --  17.3*  ?NEUTROABS 23.6*  --   --   ?HGB 13.6 15.0 12.2*  ?HCT 42.6 44.0 37.0*  ?MCV 71.0*  --  68.9*  ?PLT 488*  --  403*  ? ? ?LFT ?Recent Labs  ?Lab 09/21/21 ?1224  ?AST 22  ?ALT 40  ?ALKPHOS 99  ?BILITOT 1.0  ?PROT 7.9  ?ALBUMIN 4.0  ? ?  ?Micro : Blood cultures x2 obtained, result pending ? ? ? ?Antibiotics: ?Anti-infectives (From admission, onward)  ? ? Start     Dose/Rate Route Frequency Ordered Stop  ? 09/21/21 2200  cefTRIAXone (ROCEPHIN) 2 g in sodium chloride 0.9 % 100 mL IVPB       ? 2 g ?200 mL/hr over 30 Minutes Intravenous Every 24 hours 09/21/21 1549 09/26/21 2159  ? 09/21/21 2200  azithromycin (ZITHROMAX) 500 mg in sodium chloride 0.9 % 250 mL IVPB       ? 500 mg ?250 mL/hr over 60 Minutes Intravenous Every 24 hours 09/21/21 1549 09/26/21 2159  ? 09/21/21 1600  vancomycin (VANCOREADY) IVPB 500 mg/100 mL  Status:  Discontinued       ?See Hyperspace for full Linked Orders Report.  ? 500 mg ?100 mL/hr over 60 Minutes Intravenous  Once 09/21/21 1344 09/21/21 1549  ? 09/21/21 1400  vancomycin (VANCOREADY) IVPB 2000 mg/400 mL       ?See Hyperspace for full Linked Orders Report.  ? 2,000 mg ?200 mL/hr over 120 Minutes Intravenous  Once 09/21/21 1344 09/21/21 1748  ? 09/21/21 1330  ceFEPIme (MAXIPIME) 2 g in sodium chloride 0.9 % 100 mL IVPB       ? 2 g ?200 mL/hr over 30 Minutes Intravenous  Once 09/21/21 1328 09/21/21  1447  ? 09/21/21 1330  metroNIDAZOLE (FLAGYL) IVPB 500 mg  Status:  Discontinued       ? 500 mg ?100 mL/hr over 60 Minutes Intravenous  Once 09/21/21 1328 09/21/21 1707  ? 09/21/21 1330  vancomycin (VANCOCIN) IVPB 1000  mg/200 mL premix  Status:  Discontinued       ? 1,000 mg ?200 mL/hr over 60 Minutes Intravenous  Once 09/21/21 1328 09/21/21 1344  ? ?  ? ? ? ? ? ?DVT prophylaxis: Lovenox ? ?Code Status: Full code ? ?Family Communication: No family at bedside ? ?CONSULTS: ? ? ? ?Objective  ? ? ?Physical Examination: ? ? ?General-appears in no acute distress ?Heart-S1-S2, regular, no murmur auscultated ?Lungs-clear to auscultation bilaterally, no wheezing or crackles auscultated ?Abdomen-soft, nontender, no organomegaly ?Extremities-no edema in the lower extremities ?Neuro-alert, following commands ? ? ?Status is: Inpatient:   Diabetic ketoacidosis ? ? ? ?  ? ?Oswald Hillock ?  ?[facility] ?If 7PM-7AM, please contact night-coverage at [email or web address], ?Office  [phone] ? ? ?09/23/2021, 2:11 PM  LOS: 1 day  ? ? ? ? ? ? ? ? ? ? ?  ?

## 2021-09-23 NOTE — Progress Notes (Signed)
Speech Language Pathology Treatment: Dysphagia  ?Patient Details ?Name: Brent Blake ?MRN: [id number] ?DOB: 07/31/1990 ?Today's Date: 09/23/2021 ?Time: 5537-4827 ?SLP Time Calculation (min) (ACUTE ONLY): 30 min ? ?Assessment / Plan / Recommendation ?Clinical Impression ? Pt seen at bedside for follow up after BSE completed 09/22/21. Pt was more alert today. Voice quality improved as well, but still low in intensity. Pt completed oral care after set up. He then accepted trials of ice chips, thin liquid, and puree textures. Pt exhibited multiple swallows per bolus, raising concern for post-swallow residue. He demonstrated a delayed cough response after thin and puree consistencies, productive of thick mucus and secretions. This cough response was consistent after each presentation. ? ?Pt appears to be doing some better today, however, completion of MBS at this point is still premature based on bedside presentation. SLP will follow up next date to determine readiness to proceed with MBS or PO intake. RN and MD informed.  ?  ?HPI HPI: 31yo male admitted 09/21/21 with lethargy, vomiting, cough, confusion related to hyperglycemia. PMH: DM2, asthma, smoking, HTN, obesity, marijuana, etoh. CXR - LLL opacity. ?  ?   ?SLP Plan ? Continue with current plan of care ? ?  ?  ?Recommendations for follow up therapy are one component of a multi-disciplinary discharge planning process, led by the attending physician.  Recommendations may be updated based on patient status, additional functional criteria and insurance authorization. ?  ? ?Recommendations  ?Diet recommendations: NPO ?Medication Administration: Via alternative means  ?   ?    ?   ? ? ? ? Oral Care Recommendations: Oral care prior to ice chip/H20;Oral care QID ?Follow Up Recommendations: Other (comment) (TBD) ?Assistance recommended at discharge: Other (comment) (TBD) ?SLP Visit Diagnosis: Dysphagia, unspecified (R13.10) ?Plan: Continue with current plan of care ? ? ? ? ?   ?  ? ?Zakariyya Helfman B. Mischa Pollard, MSP, CCC-SLP ?Speech Language Pathologist ?Office: [phone] ? ?Leigh Aurora ?09/23/2021, 12:43 PM ?

## 2021-09-24 LAB — BASIC METABOLIC PANEL
Anion gap: 10 (ref 5–15)
Anion gap: 11 (ref 5–15)
BUN: 12 mg/dL (ref 6–20)
BUN: 12 mg/dL (ref 6–20)
CO2: 22 mmol/L (ref 22–32)
CO2: 22 mmol/L (ref 22–32)
Calcium: 8.7 mg/dL — ABNORMAL LOW (ref 8.9–10.3)
Calcium: 8.7 mg/dL — ABNORMAL LOW (ref 8.9–10.3)
Chloride: 104 mmol/L (ref 98–111)
Chloride: 106 mmol/L (ref 98–111)
Creatinine, Ser: 0.86 mg/dL (ref 0.61–1.24)
Creatinine, Ser: 0.93 mg/dL (ref 0.61–1.24)
GFR, Estimated: >60 mL/min (ref >60)
GFR, Estimated: >60 mL/min (ref >60)
Glucose, Bld: 158 mg/dL — ABNORMAL HIGH (ref 70–99)
Glucose, Bld: 197 mg/dL — ABNORMAL HIGH (ref 70–99)
Potassium: 2.9 mmol/L — ABNORMAL LOW (ref 3.5–5.1)
Potassium: 3.2 mmol/L — ABNORMAL LOW (ref 3.5–5.1)
Sodium: 137 mmol/L (ref 135–145)
Sodium: 138 mmol/L (ref 135–145)

## 2021-09-24 LAB — GLUCOSE, CAPILLARY
Glucose-Capillary: 167 mg/dL — ABNORMAL HIGH (ref 70–99)
Glucose-Capillary: 177 mg/dL — ABNORMAL HIGH (ref 70–99)
Glucose-Capillary: 163 mg/dL — ABNORMAL HIGH (ref 70–99)
Glucose-Capillary: 182 mg/dL — ABNORMAL HIGH (ref 70–99)
Glucose-Capillary: 171 mg/dL — ABNORMAL HIGH (ref 70–99)
Glucose-Capillary: 186 mg/dL — ABNORMAL HIGH (ref 70–99)
Glucose-Capillary: 262 mg/dL — ABNORMAL HIGH (ref 70–99)
Glucose-Capillary: 236 mg/dL — ABNORMAL HIGH (ref 70–99)
Glucose-Capillary: 305 mg/dL — ABNORMAL HIGH (ref 70–99)
Glucose-Capillary: 249 mg/dL — ABNORMAL HIGH (ref 70–99)

## 2021-09-24 LAB — BETA-HYDROXYBUTYRIC ACID: Beta-Hydroxybutyric Acid: 0.21 mmol/L (ref 0.05–0.27)

## 2021-09-24 MED ORDER — SODIUM CHLORIDE 0.9 % IV SOLN: 250.0000 mL | INTRAVENOUS | Status: DC | PRN

## 2021-09-24 MED ORDER — GUAIFENESIN ER 600 MG PO TB12
600.0000 mg | ORAL_TABLET | Freq: Two times a day (BID) | ORAL | Status: DC
  Administered 2021-09-24 – 2021-09-26 (×5): 600 mg via ORAL
  Filled 2021-09-24 (×5): qty 1

## 2021-09-24 MED ORDER — INSULIN GLARGINE-YFGN 100 UNIT/ML ~~LOC~~ SOLN
10.0000 [IU] | Freq: Once | SUBCUTANEOUS | Status: AC
  Administered 2021-09-24: 10 [IU] via SUBCUTANEOUS
  Filled 2021-09-24: qty 0.1

## 2021-09-24 MED ORDER — INSULIN ASPART 100 UNIT/ML IJ SOLN
0.0000 [IU] | Freq: Three times a day (TID) | INTRAMUSCULAR | Status: DC
  Administered 2021-09-24: 8 [IU] via SUBCUTANEOUS
  Administered 2021-09-24: 11 [IU] via SUBCUTANEOUS
  Administered 2021-09-24 – 2021-09-25 (×2): 5 [IU] via SUBCUTANEOUS
  Administered 2021-09-25: 3 [IU] via SUBCUTANEOUS
  Administered 2021-09-25 – 2021-09-26 (×2): 11 [IU] via SUBCUTANEOUS
  Administered 2021-09-26: 5 [IU] via SUBCUTANEOUS

## 2021-09-24 MED ORDER — SODIUM CHLORIDE 3 % IN NEBU
4.0000 mL | INHALATION_SOLUTION | Freq: Two times a day (BID) | RESPIRATORY_TRACT | Status: AC
  Administered 2021-09-24 (×2): 4 mL via RESPIRATORY_TRACT
  Filled 2021-09-24 (×2): qty 4

## 2021-09-24 MED ORDER — ALBUTEROL SULFATE (2.5 MG/3ML) 0.083% IN NEBU: 2.5000 mg | INHALATION_SOLUTION | Freq: Four times a day (QID) | RESPIRATORY_TRACT | Status: DC | PRN

## 2021-09-24 MED ORDER — BENZONATATE 100 MG PO CAPS
100.0000 mg | ORAL_CAPSULE | Freq: Three times a day (TID) | ORAL | Status: DC | PRN
  Administered 2021-09-24: 100 mg via ORAL
  Filled 2021-09-24: qty 1

## 2021-09-24 MED ORDER — SODIUM CHLORIDE 0.9% FLUSH: 3.0000 mL | INTRAVENOUS | Status: DC | PRN

## 2021-09-24 MED ORDER — INSULIN GLARGINE-YFGN 100 UNIT/ML ~~LOC~~ SOLN
10.0000 [IU] | Freq: Every day | SUBCUTANEOUS | Status: DC
  Administered 2021-09-25: 10 [IU] via SUBCUTANEOUS
  Filled 2021-09-24: qty 0.1

## 2021-09-24 MED ORDER — INSULIN ASPART 100 UNIT/ML IJ SOLN
3.0000 [IU] | Freq: Three times a day (TID) | INTRAMUSCULAR | Status: DC
  Administered 2021-09-24 – 2021-09-26 (×6): 3 [IU] via SUBCUTANEOUS

## 2021-09-24 MED ORDER — POTASSIUM CHLORIDE 10 MEQ/100ML IV SOLN
10.0000 meq | INTRAVENOUS | Status: AC
  Administered 2021-09-24 (×6): 10 meq via INTRAVENOUS
  Filled 2021-09-24 (×6): qty 100

## 2021-09-24 MED ORDER — SODIUM CHLORIDE 0.9% FLUSH
3.0000 mL | Freq: Two times a day (BID) | INTRAVENOUS | Status: DC
  Administered 2021-09-24 – 2021-09-26 (×6): 3 mL via INTRAVENOUS

## 2021-09-24 MED ORDER — INSULIN ASPART 100 UNIT/ML IJ SOLN
0.0000 [IU] | Freq: Every day | INTRAMUSCULAR | Status: DC
  Administered 2021-09-24: 2 [IU] via SUBCUTANEOUS
  Administered 2021-09-25: 5 [IU] via SUBCUTANEOUS

## 2021-09-24 NOTE — TOC Initial Note (Signed)
Transition of Care (TOC) - Initial/Assessment Note  ? ? ?Patient Details  ?Name: Brent Blake ?MRN: [id number] ?Date of Birth: 1990/12/28 ? ?Transition of Care (TOC) CM/SW Contact:    ?Linn Goetze, LCSW ?Phone Number: ?09/24/2021, 11:41 AM ? ?Clinical Narrative:                 ?Met with pt and s/o today to introduce self/ TOC role with dc planning needs.  Pt confirming that he is currently uninsured and without a PCP.  He is very agreeable with TOC assistance in securing a PCP at a local [facility] clinic.  Able to get appointment with Geryl Rankins, NP at [facility] on 4/24 @ 2:30pm.  Anticipate pt will need MATCH assistance as well when he is approaching medical readiness for dc.  TOC will continue to follow. ? ?Expected Discharge Plan: Home/Self Care ?Barriers to Discharge: Continued Medical Work up ? ? ?Patient Goals and CMS Choice ?Patient states their goals for this hospitalization and ongoing recovery are:: return home ?  ?  ? ?Expected Discharge Plan and Services ?Expected Discharge Plan: Home/Self Care ?In-house Referral: Clinical Social Work ?  ?  ?Living arrangements for the past 2 months: Apartment ?                ?  ?  ?  ?  ?  ?  ?  ?  ?  ?  ? ?Prior Living Arrangements/Services ?Living arrangements for the past 2 months: Apartment ?Lives with:: Significant Other ?Patient language and need for interpreter reviewed:: Yes ?Do you feel safe going back to the place where you live?: Yes      ?Need for Family Participation in Patient Care: No (Comment) ?Care giver support system in place?: Yes (comment) ?  ?Criminal Activity/Legal Involvement Pertinent to Current Situation/Hospitalization: No - Comment as needed ? ?Activities of Daily Living ?Home Assistive Devices/Equipment: None ?ADL Screening (condition at time of admission) ?Patient's cognitive ability adequate to safely complete daily activities?: Yes ?Is the patient deaf or have difficulty hearing?: No ?Does the patient have difficulty  seeing, even when wearing glasses/contacts?: No ?Does the patient have difficulty concentrating, remembering, or making decisions?: No ?Patient able to express need for assistance with ADLs?: Yes ?Does the patient have difficulty dressing or bathing?: No ?Independently performs ADLs?: No ?Communication: Independent ?Dressing (OT): Needs assistance ?Is this a change from baseline?: Change from baseline, expected to last <3days ?Grooming: Independent ?Feeding: Independent ?Toileting: Independent with device (comment) ?In/Out Bed: Needs assistance ?Is this a change from baseline?: Change from baseline, expected to last <3 days ?Walks in Home: Independent ?Does the patient have difficulty walking or climbing stairs?: No ?Weakness of Legs: Both ?Weakness of Arms/Hands: None ? ?Permission Sought/Granted ?Permission sought to share information with : Family Supports ?Permission granted to share information with : Yes, Verbal Permission Granted ? Share Information with NAME: Brandy Hale ?   ? Permission granted to share info w Relationship: s/o ? Permission granted to share info w Contact Information: [phone] ? ?Emotional Assessment ?Appearance:: Appears stated age ?Attitude/Demeanor/Rapport: Gracious ?Affect (typically observed): Frustrated ?Orientation: : Oriented to Self, Oriented to Place, Oriented to  Time, Oriented to Situation ?Alcohol / Substance Use: Not Applicable ?Psych Involvement: No (comment) ? ?Admission diagnosis:  Wheezing [R06.2] ?DKA (diabetic ketoacidosis) ([place]) [E11.10] ?Diabetic ketoacidosis without coma associated with type 2 diabetes mellitus ([place]) [E11.10] ?Patient Active Problem List  ? Diagnosis Date Noted  ? Dysphagia 09/23/2021  ? Hypokalemia 09/22/2021  ?  DKA (diabetic ketoacidosis) ([place]) 09/22/2021  ? Uncontrolled type 2 diabetes mellitus with ketoacidosis and coma, without long-term current use of insulin ([place]) 09/21/2021  ? Acute renal failure ([place]) 09/21/2021  ? Hyperkalemia  09/21/2021  ? Severe sepsis ([place]) 09/21/2021  ? Community acquired pneumonia of left lower lobe of lung 09/21/2021  ? Hematemesis 09/21/2021  ? Acute metabolic encephalopathy 76/39/4320  ? Asthma, chronic 09/21/2021  ? Essential hypertension 09/21/2021  ? Obesity (BMI 30-39.9) 09/21/2021  ? Hyponatremia 09/21/2021  ? TOBACCO USER 04/18/2009  ? RHINITIS, ALLERGIC 09/07/2006  ? ?PCP:  Riley Churches, MD ?Pharmacy:   ?[id number] [address] ?[address] ?[place] [address] ?Phone: [phone] Fax: [phone] ? ?[facility] ?[address] ?[place] [address] ?Phone: [phone] Fax: [phone] ? ?[place] ?[place] [address] ?Phone: [phone] Fax: [phone] ? ?[facility] [id number] ?[address] ?[place] [address] ?Phone: [phone] Fax: [phone] ? ? ? ? ?Social Determinants of Health (SDOH) Interventions ?  ? ?Readmission Risk Interventions ?No flowsheet data found. ? ? ?

## 2021-09-24 NOTE — Progress Notes (Signed)
[facility] ? ?PROGRESS NOTE ? ?ABDULRAHIM SIDDIQI GGY:[id number] DOB: 09-Feb-1991 DOA: 09/21/2021 ?PCP: Riley Churches, MD ? ?Brief hospital course ? ?Mr. Halt is a 31 y.o. M with T2DM never on treatment, smoking, and HTN and obesity who presented with lethargy, vomiting, as well as cough, confusion.' ? ?All history collected from partner at bedside.  Evidently, few days ago, was coughing real bad, vomiting all over the house.  Partner took him to her house, was trying to treat him there, but eventually realized and told him this was "Not no regular cold, you got to go the hospital."  Unfortunately, by htat time he was confused and couldn't even stand, so EMS were activated.   ? ?She thinks he has a history of diabetes and hypertension.  I also see albuterol listed in his prior meds.    ? ?In the ER, Blood sugar >1000, anion gap 36, Bicarb 7, VBG showed pH 7.1.  He appeared lethargic and unresponsive.  CXR had a left base opacity.  He had reported hematemesis.  Was given 2L fluids, started on insulin drip and antibiotics. ? ? ?  ? ? ? ?Subjective  ? ?Patient seen and examined, he is more alert today.  CT chest with contrast and CT neck with contrast obtained yesterday does not show any significant pathology except for esophagitis.  Patient had failed swallow evaluation so is currently n.p.o.  DKA has resolved.  He is only on IV insulin for hyperglycemia protocol. ? ?  ? ?Assessment and Plan: ? ?* Uncontrolled type 2 diabetes mellitus with ketoacidosis and coma, without long-term current use of insulin ([place]) ?Diabetes noted in chart as far back as 2018, partner says she has never seen him take medicine int he time she has known him (~48yr and I see no record in notes of ever being prescribed medicine. ? ?-Presented with severe DKA and altered mental status ?-DKA has resolved ?-Mental status back to baseline ?-Patient is now on IV insulin for hyperglycemia protocol; as he is n.p.o. ?-Once speech therapy clears him  for oral diet, will discontinue IV insulin and start sliding scale insulin with NovoLog ? ? ? ? ? ? ? ?Acute renal failure ([place] ?Cr 3.9 from baseline in 2021 of 0.9.   ?- Started on IV fluids ?-Creatinine has improved to 1.10 ? ?Severe sepsis ([place] ?Presented with hypothermia, tachycardia leukocytosis and LLL opacity with renal failure and encephalopathy.  Cannot rule out this is severe sepsis. ? ?- Continue Rocephin and azithromycin ?- Repeat lactate is 1.5 ?- Follow blood cultures ? ?Community acquired pneumonia of left lower lobe of lung ?- Chest x-ray obtained  showed bibasilar opacities ?-Patient is currently on Rocephin and Zithromax ?-Follow blood culture results ? ?Dysphagia ?- Patient not swallowing due to mental status change/DKA ?-Swallow evaluation obtained, patient is currently n.p.o. ?-High aspiration risk ? ?Hypokalemia ?- Potassium is 2.9 ?-Replace potassium and follow BMP in am ? ?Hyponatremia ?- Sodium improved to 137 ? ?Obesity (BMI 30-39.9) ?BMI 33 ? ?Essential hypertension ?- Blood pressure has been elevated ?-Continue IV hydralazine as needed ? ?Asthma, chronic ?- Stable ? ?Acute metabolic encephalopathy ?Due to DKA and/or sepsis.   ?-Serum osmolality was high at 324 ?-Repeat serum osmolality 301 from 09/23/2021 ? ? ?Hematemesis ?Reported hematemesis in ER with bright red streaking.   Probably Mallory-Weiss tear given the extent of vomiting per partner, low suspicion at present for esophageal varices or gastric ulcer. ?- Obtained CT chest/CT neck today ?-Only shows thickening of  esophagus consistent with esophagitis ?-Continue Protonix 40 mg IV twice daily ? ? ? ? ? ?  ? ?Medications ? ?  ? Chlorhexidine Gluconate Cloth  6 each Topical Daily  ? enoxaparin (LOVENOX) injection  40 mg Subcutaneous Q24H  ? insulin aspart  0-15 Units Subcutaneous TID WC  ? insulin aspart  0-5 Units Subcutaneous QHS  ? insulin starter kit- pen needles  1 kit Other Once  ? pantoprazole (PROTONIX) IV  40 mg Intravenous  Q12H  ? sodium chloride flush  3 mL Intravenous Q12H  ? ? ? Data Reviewed:  ? ?CBG: ? ?Recent Labs  ?Lab 09/24/21 ?0158 09/24/21 ?0250 09/24/21 ?0321 09/24/21 ?2248 09/24/21 ?2500  ?GLUCAP 163* 182* 186* 171* 262*  ? ? ?SpO2: 98 %  ? ? ?Vitals:  ? 09/24/21 0715 09/24/21 0800 09/24/21 0838 09/24/21 0936  ?BP:  (!) 188/95  137/67  ?Pulse:  (!) 112  (!) 128  ?Resp:  (!) 25  16  ?Temp: 99 ?F (37.2 ?C)     ?TempSrc: Oral     ?SpO2:  95% 99% 98%  ?Weight:      ?Height:      ? ? ? ? ?Data Reviewed: ? ?Basic Metabolic Panel: ?Recent Labs  ?Lab 09/23/21 ?3704 09/23/21 ?8889 09/23/21 ?1427 09/23/21 ?2040 09/24/21 ?0017 09/24/21 ?0543  ?NA 134* 136 136 135 138 137  ?K 3.0* 3.0* 2.8* 2.8* 3.2* 2.9*  ?CL 102 105 102 105 106 104  ?CO2 16* 19* '24 23 22 22  ' ?GLUCOSE 256* 233* 216* 197* 158* 197*  ?BUN 28* 24* '17 13 12 12  ' ?CREATININE 1.35* 1.10 1.04 0.82 0.93 0.86  ?CALCIUM 9.2 9.2 8.8* 8.8* 8.7* 8.7*  ?MG 2.9*  --   --  2.4  --   --   ? ? ?CBC: ?Recent Labs  ?Lab 09/21/21 ?1224 09/21/21 ?1236 09/22/21 ?0700  ?WBC 27.9*  --  17.3*  ?NEUTROABS 23.6*  --   --   ?HGB 13.6 15.0 12.2*  ?HCT 42.6 44.0 37.0*  ?MCV 71.0*  --  68.9*  ?PLT 488*  --  403*  ? ? ?LFT ?Recent Labs  ?Lab 09/21/21 ?1224  ?AST 22  ?ALT 40  ?ALKPHOS 99  ?BILITOT 1.0  ?PROT 7.9  ?ALBUMIN 4.0  ? ?  ?Micro : Blood cultures x2 obtained, result pending ? ? ? ?Antibiotics: ?Anti-infectives (From admission, onward)  ? ? Start     Dose/Rate Route Frequency Ordered Stop  ? 09/21/21 2200  cefTRIAXone (ROCEPHIN) 2 g in sodium chloride 0.9 % 100 mL IVPB       ? 2 g ?200 mL/hr over 30 Minutes Intravenous Every 24 hours 09/21/21 1549 09/26/21 2159  ? 09/21/21 2200  azithromycin (ZITHROMAX) 500 mg in sodium chloride 0.9 % 250 mL IVPB       ? 500 mg ?250 mL/hr over 60 Minutes Intravenous Every 24 hours 09/21/21 1549 09/26/21 2159  ? 09/21/21 1600  vancomycin (VANCOREADY) IVPB 500 mg/100 mL  Status:  Discontinued       ?See Hyperspace for full Linked Orders Report.  ? 500 mg ?100  mL/hr over 60 Minutes Intravenous  Once 09/21/21 1344 09/21/21 1549  ? 09/21/21 1400  vancomycin (VANCOREADY) IVPB 2000 mg/400 mL       ?See Hyperspace for full Linked Orders Report.  ? 2,000 mg ?200 mL/hr over 120 Minutes Intravenous  Once 09/21/21 1344 09/21/21 1748  ? 09/21/21 1330  ceFEPIme (MAXIPIME) 2 g in sodium chloride 0.9 % 100  mL IVPB       ? 2 g ?200 mL/hr over 30 Minutes Intravenous  Once 09/21/21 1328 09/21/21 1447  ? 09/21/21 1330  metroNIDAZOLE (FLAGYL) IVPB 500 mg  Status:  Discontinued       ? 500 mg ?100 mL/hr over 60 Minutes Intravenous  Once 09/21/21 1328 09/21/21 1707  ? 09/21/21 1330  vancomycin (VANCOCIN) IVPB 1000 mg/200 mL premix  Status:  Discontinued       ? 1,000 mg ?200 mL/hr over 60 Minutes Intravenous  Once 09/21/21 1328 09/21/21 1344  ? ?  ? ? ? ? ? ?DVT prophylaxis: Lovenox ? ?Code Status: Full code ? ?Family Communication: No family at bedside ? ?CONSULTS: ? ? ? ?Objective  ? ? ?Physical Examination: ? ? ?General-appears in no acute distress ?Heart-S1-S2, regular, no murmur auscultated ?Lungs-clear to auscultation bilaterally, no wheezing or crackles auscultated ?Abdomen-soft, nontender, no organomegaly ?Extremities-no edema in the lower extremities ?Neuro-alert, oriented x3, no focal deficit noted ? ?Status is: Inpatient:   Diabetic ketoacidosis ? ? ? ?  ? ?Oswald Hillock ?  ?[facility] ?If 7PM-7AM, please contact night-coverage at [email or web address], ?Office  [phone] ? ? ?09/24/2021, 10:05 AM  LOS: 2 days  ? ? ? ? ? ? ? ? ? ? ?  ?

## 2021-09-24 NOTE — Progress Notes (Signed)
Inpatient Diabetes Program Recommendations ? ?AACE/ADA: New Consensus Statement on Inpatient Glycemic Control (2015) ? ?Target Ranges:  Prepandial:   less than 140 mg/dL ?     Peak postprandial:   less than 180 mg/dL (1-2 hours) ?     Critically ill patients:  140 - 180 mg/dL  ? ?Lab Results  ?Component Value Date  ? GLUCAP 236 (H) 09/24/2021  ? HGBA1C 13.8 (H) 09/21/2021  ? ? ?Review of Glycemic Control ? ?Diabetes history: DM2 ?Outpatient Diabetes medications: None ?Current orders for Inpatient glycemic control: Semglee 10 units (1x dose at 0345), Novolog 0-15 units TID with meals and 0-5 HS ? ?Transitioned off IV insulin and given 10 units of Semglee. ?CHO mod diet to start at lunch today ?PCP appt at [facility] on 4/24 @ 2:30 pm ?HgbA1C - 13.8% ? ?Inpatient Diabetes Program Recommendations:   ? ?Semglee 10 units BID ?Add Novolog 3 units TID with meals if eating > 50%. ? ?Spoke with patient about new diabetes diagnosis.  Discussed A1C results (13.8%) and explained what an A1C is and informed patient that his current A1C indicates an average glucose of 349 mg/dl over the past 2-3 months. Discussed basic pathophysiology of DM Type 2, basic home care, importance of checking CBGs and maintaining good CBG control to prevent long-term and short-term complications. Reviewed glucose and A1C goals and explained that patient will need to continue to monitor blood sugars at least 3-4x/day and take logbook to MD for review. Reviewed signs and symptoms of hyperglycemia and hypoglycemia along with treatment for both. Discussed impact of nutrition, exercise, stress, sickness, and medications on diabetes control. Pt states he has been eating a bunch of junk and not taking care of himself. Has been drinking ETOH in excess and states he is ready to quit. Reviewed Living Well with diabetes booklet and encouraged patient to read through entire book.  ?Educated patient on insulin pen use at home. Reviewed contents of insulin flexpen  starter kit. Reviewed all steps if insulin pen including attachment of needle, 2-unit air shot, dialing up dose, giving injection, removing needle, disposal of sharps, storage of unused insulin, disposal of insulin etc. Patient able to provide successful return demonstration. Also reviewed troubleshooting with insulin pen. MD to give patient Rxs for insulin pens and insulin pen needles.  ?Spoke with GF about diet, monitoring and importance of f/u with PCP.  ? ?Has appt at [facility] on 11/01/21.  ? ?Continue to titrate insulin until blood sugars at goal of 140-180 mg/dL. ? ?Thank you. ?Lorenda Peck, RD, LDN, CDE ?Inpatient Diabetes Coordinator ?[phone]  ? ? ? ? ? ? ? ?

## 2021-09-24 NOTE — Progress Notes (Signed)
Speech Language Pathology Treatment: Dysphagia  ?Patient Details ?Name: Brent Blake ?MRN: [id number] ?DOB: 1991/04/22 ?Today's Date: 09/24/2021 ?Time: 6269-4854 ?SLP Time Calculation (min) (ACUTE ONLY): 13 min ? ?Assessment / Plan / Recommendation ?Clinical Impression ? Pt with improved mental status; partner at bedside. Demonstrated intermittent coughing and expectoration of secretions prior to PO intake.  Consumed crackers, applesauce and thin liquids with overall good oral attention, no exacerbation of cough during intake, strong vocal quality.  Recommend resuming a regular diet (carb mod) with thin liquids.  No f/u SLP is needed. Our service will sign off. ? ?  ?HPI HPI: 31yo male admitted 09/21/21 with lethargy, vomiting, cough, confusion related to hyperglycemia. PMH: DM2, asthma, smoking, HTN, obesity, marijuana, etoh. CXR - LLL opacity. ?  ?   ?SLP Plan ? All goals met ? ?  ?  ?Recommendations for follow up therapy are one component of a multi-disciplinary discharge planning process, led by the attending physician.  Recommendations may be updated based on patient status, additional functional criteria and insurance authorization. ?  ? ?Recommendations  ?Diet recommendations: Regular;Thin liquid ?Liquids provided via: Cup;Straw ?Medication Administration: Whole meds with liquid ?Supervision: Patient able to self feed  ?   ?    ?   ? ? ? ? Oral Care Recommendations: Oral care BID ?Follow Up Recommendations: No SLP follow up ?SLP Visit Diagnosis: Dysphagia, unspecified (R13.10) ?Plan: All goals met ? ? ? ? ?  ?  ? ?Brent Blake L. Fawna Cranmer, MA CCC/SLP ?Acute Rehabilitation Services ?Office number [phone] ?Pager [phone] ? ?Brent Blake ? ?09/24/2021, 12:16 PM ?

## 2021-09-25 LAB — GLUCOSE, CAPILLARY
Glucose-Capillary: 249 mg/dL — ABNORMAL HIGH (ref 70–99)
Glucose-Capillary: 315 mg/dL — ABNORMAL HIGH (ref 70–99)
Glucose-Capillary: 195 mg/dL — ABNORMAL HIGH (ref 70–99)
Glucose-Capillary: 366 mg/dL — ABNORMAL HIGH (ref 70–99)

## 2021-09-25 LAB — MAGNESIUM: Magnesium: 2.6 mg/dL — ABNORMAL HIGH (ref 1.7–2.4)

## 2021-09-25 LAB — BASIC METABOLIC PANEL
Anion gap: 15 (ref 5–15)
BUN: 13 mg/dL (ref 6–20)
CO2: 18 mmol/L — ABNORMAL LOW (ref 22–32)
Calcium: 8.7 mg/dL — ABNORMAL LOW (ref 8.9–10.3)
Chloride: 99 mmol/L (ref 98–111)
Creatinine, Ser: 0.91 mg/dL (ref 0.61–1.24)
GFR, Estimated: >60 mL/min (ref >60)
Glucose, Bld: 374 mg/dL — ABNORMAL HIGH (ref 70–99)
Potassium: 3.2 mmol/L — ABNORMAL LOW (ref 3.5–5.1)
Sodium: 132 mmol/L — ABNORMAL LOW (ref 135–145)

## 2021-09-25 LAB — POTASSIUM: Potassium: 3.2 mmol/L — ABNORMAL LOW (ref 3.5–5.1)

## 2021-09-25 MED ORDER — POTASSIUM CHLORIDE CRYS ER 20 MEQ PO TBCR
40.0000 meq | EXTENDED_RELEASE_TABLET | ORAL | Status: AC
  Administered 2021-09-25 (×2): 40 meq via ORAL
  Filled 2021-09-25 (×2): qty 2

## 2021-09-25 MED ORDER — INSULIN GLARGINE-YFGN 100 UNIT/ML ~~LOC~~ SOLN
10.0000 [IU] | Freq: Two times a day (BID) | SUBCUTANEOUS | Status: DC
  Administered 2021-09-25 – 2021-09-26 (×2): 10 [IU] via SUBCUTANEOUS
  Filled 2021-09-25 (×3): qty 0.1

## 2021-09-25 MED ORDER — LISINOPRIL 10 MG PO TABS
10.0000 mg | ORAL_TABLET | Freq: Every day | ORAL | Status: DC
  Administered 2021-09-25 – 2021-09-26 (×2): 10 mg via ORAL
  Filled 2021-09-25 (×2): qty 1

## 2021-09-25 MED ORDER — METOPROLOL TARTRATE 25 MG PO TABS
25.0000 mg | ORAL_TABLET | Freq: Two times a day (BID) | ORAL | Status: DC
  Administered 2021-09-25 – 2021-09-26 (×3): 25 mg via ORAL
  Filled 2021-09-25 (×3): qty 1

## 2021-09-25 NOTE — Progress Notes (Signed)
Report called to [facility] on the 4th floor. Patient to be transported to room 1426. ?

## 2021-09-25 NOTE — Progress Notes (Signed)
[facility] ? ?PROGRESS NOTE ? ?Brent Blake HMC:[id number] DOB: Jun 27, 1991 DOA: 09/21/2021 ?PCP: Brent Pounds, NP ? ?Brief hospital course ? ?Brent Blake is a 31 y.o. M with T2DM never on treatment, smoking, and HTN and obesity who presented with lethargy, vomiting, as well as cough, confusion.' ? ?All history collected from partner at bedside.  Evidently, few days ago, was coughing real bad, vomiting all over the house.  Partner took him to her house, was trying to treat him there, but eventually realized and told him this was "Not no regular cold, you got to go the hospital."  Unfortunately, by htat time he was confused and couldn't even stand, so EMS were activated.   ? ?She thinks he has a history of diabetes and hypertension.  I also see albuterol listed in his prior meds.    ? ?In the ER, Blood sugar >1000, anion gap 36, Bicarb 7, VBG showed pH 7.1.  He appeared lethargic and unresponsive.  CXR had a left base opacity.  He had reported hematemesis.  Was given 2L fluids, started on insulin drip and antibiotics. ? ? ?  ? ? ? ?Subjective  ? ?Patient seen and examined, he is off of IV insulin.  DKA has resolved.  Patient tolerating diet well.  Continues to have low potassium.  CBG still elevated. ? ?  ? ?Assessment and Plan: ? ?* Uncontrolled type 2 diabetes mellitus with ketoacidosis and coma, without long-term current use of insulin ([place]) ?Diabetes noted in chart as far back as 2018, partner says she has never seen him take medicine int he time she has known him (~71yr and I see no record in notes of ever being prescribed medicine. ? ?-Presented with severe DKA and altered mental status ?-DKA has resolved ?-Mental status back to baseline ?-IV insulin has been discontinued ?-Patient started on Lantus 10 units subcu twice daily, sliding scale insulin with NovoLog ?-NovoLog 3 units 3 times daily with meals ? ? ? ? ? ? ? ?[place] ?Cr 3.9 from baseline in 2021 of 0.9.   ?- Started on IV  fluids ?-Creatinine has improved to 1.10 ? ?Severe sepsis ([facility] ?Presented with hypothermia, tachycardia leukocytosis and LLL opacity with renal failure and encephalopathy.  Cannot rule out this is severe sepsis. ? ?- Continue Rocephin and azithromycin ?- Repeat lactate is 1.5 ?- Follow blood cultures ? ?Community acquired pneumonia of left lower lobe of lung ?- Chest x-Blake obtained  showed bibasilar opacities ?-Patient has completed 5 days of Rocephin and Zithromax ?-Follow blood culture results ? ?Dysphagia ?- Patient was not swallowing due to mental status change/DKA ?-Swallow evaluation obtained, initially he was kept n.p.o. ?-After mental status improved patient is able to swallow well ?-Started on regular diet ? ?Hypokalemia ?- Potassium is 3.2 despite replacement ?-We will replace potassium and follow BMP in am ?-We will recheck serum magnesium today ? ? ?Hyponatremia ?- Sodium improved to 137 ? ?Obesity (BMI 30-39.9) ?BMI 33 ? ?Essential hypertension ?- Blood pressure has been elevated ?-Continue IV hydralazine as needed ? ?Asthma, chronic ?- Stable ? ?Acute metabolic encephalopathy ?Due to DKA and/or sepsis.   ?-Serum osmolality was high at 324 ?-Repeat serum osmolality 301 from 09/23/2021 ? ? ?Hematemesis ?Reported hematemesis in ER with bright red streaking.   Probably Mallory-Weiss tear given the extent of vomiting per partner, low suspicion at present for esophageal varices or gastric ulcer. ?- Obtained CT chest/CT neck today ?-Only shows thickening of esophagus consistent with esophagitis ?-  Continue Protonix 40 mg IV twice daily ? ? ? ? ? ?  ? ?Medications ? ?  ? Chlorhexidine Gluconate Cloth  6 each Topical Daily  ? enoxaparin (LOVENOX) injection  40 mg Subcutaneous Q24H  ? guaiFENesin  600 mg Oral BID  ? insulin aspart  0-15 Units Subcutaneous TID WC  ? insulin aspart  0-5 Units Subcutaneous QHS  ? insulin aspart  3 Units Subcutaneous TID WC  ? insulin glargine-yfgn  10 Units Subcutaneous BID  ?  insulin starter kit- pen needles  1 kit Other Once  ? lisinopril  10 mg Oral Daily  ? metoprolol tartrate  25 mg Oral BID  ? pantoprazole (PROTONIX) IV  40 mg Intravenous Q12H  ? potassium chloride  40 mEq Oral Q4H  ? sodium chloride flush  3 mL Intravenous Q12H  ? ? ? Data Reviewed:  ? ?CBG: ? ?Recent Labs  ?Lab 09/24/21 ?1129 09/24/21 ?1635 09/24/21 ?2111 09/25/21 ?2706 09/25/21 ?1155  ?GLUCAP 236* 305* 249* 249* 315*  ? ? ?SpO2: 99 %  ? ? ?Vitals:  ? 09/25/21 1002 09/25/21 1100 09/25/21 1300 09/25/21 1356  ?BP:  (!) 155/91 (!) 150/73   ?Pulse:  92 (!) 114   ?Resp:  19 19   ?Temp: 98.7 ?F (37.1 ?C)   97.6 ?F (36.4 ?C)  ?TempSrc: Oral   Oral  ?SpO2:  99% 99%   ?Weight:      ?Height:      ? ? ? ? ?Data Reviewed: ? ?Basic Metabolic Panel: ?Recent Labs  ?Lab 09/23/21 ?2376 09/23/21 ?2831 09/23/21 ?1427 09/23/21 ?2040 09/24/21 ?0017 09/24/21 ?5176 09/25/21 ?1607 09/25/21 ?1349  ?NA 134*   < > 136 135 138 137 132*  --   ?K 3.0*   < > 2.8* 2.8* 3.2* 2.9* 3.2* 3.2*  ?CL 102   < > 102 105 106 104 99  --   ?CO2 16*   < > '24 23 22 22 ' 18*  --   ?GLUCOSE 256*   < > 216* 197* 158* 197* 374*  --   ?BUN 28*   < > '17 13 12 12 13  ' --   ?CREATININE 1.35*   < > 1.04 0.82 0.93 0.86 0.91  --   ?CALCIUM 9.2   < > 8.8* 8.8* 8.7* 8.7* 8.7*  --   ?MG 2.9*  --   --  2.4  --   --   --   --   ? < > = values in this interval not displayed.  ? ? ?CBC: ?Recent Labs  ?Lab 09/21/21 ?1224 09/21/21 ?1236 09/22/21 ?0700  ?WBC 27.9*  --  17.3*  ?NEUTROABS 23.6*  --   --   ?HGB 13.6 15.0 12.2*  ?HCT 42.6 44.0 37.0*  ?MCV 71.0*  --  68.9*  ?PLT 488*  --  403*  ? ? ?LFT ?Recent Labs  ?Lab 09/21/21 ?1224  ?AST 22  ?ALT 40  ?ALKPHOS 99  ?BILITOT 1.0  ?PROT 7.9  ?ALBUMIN 4.0  ? ?  ?Micro : Blood cultures x2 obtained, result pending ? ? ? ?Antibiotics: ?Anti-infectives (From admission, onward)  ? ? Start     Dose/Rate Route Frequency Ordered Stop  ? 09/21/21 2200  cefTRIAXone (ROCEPHIN) 2 g in sodium chloride 0.9 % 100 mL IVPB       ? 2 g ?200 mL/hr over 30  Minutes Intravenous Every 24 hours 09/21/21 1549 09/26/21 2159  ? 09/21/21 2200  azithromycin (ZITHROMAX) 500 mg in sodium chloride  0.9 % 250 mL IVPB       ? 500 mg ?250 mL/hr over 60 Minutes Intravenous Every 24 hours 09/21/21 1549 09/26/21 2159  ? 09/21/21 1600  vancomycin (VANCOREADY) IVPB 500 mg/100 mL  Status:  Discontinued       ?See Hyperspace for full Linked Orders Report.  ? 500 mg ?100 mL/hr over 60 Minutes Intravenous  Once 09/21/21 1344 09/21/21 1549  ? 09/21/21 1400  vancomycin (VANCOREADY) IVPB 2000 mg/400 mL       ?See Hyperspace for full Linked Orders Report.  ? 2,000 mg ?200 mL/hr over 120 Minutes Intravenous  Once 09/21/21 1344 09/21/21 1748  ? 09/21/21 1330  ceFEPIme (MAXIPIME) 2 g in sodium chloride 0.9 % 100 mL IVPB       ? 2 g ?200 mL/hr over 30 Minutes Intravenous  Once 09/21/21 1328 09/21/21 1447  ? 09/21/21 1330  metroNIDAZOLE (FLAGYL) IVPB 500 mg  Status:  Discontinued       ? 500 mg ?100 mL/hr over 60 Minutes Intravenous  Once 09/21/21 1328 09/21/21 1707  ? 09/21/21 1330  vancomycin (VANCOCIN) IVPB 1000 mg/200 mL premix  Status:  Discontinued       ? 1,000 mg ?200 mL/hr over 60 Minutes Intravenous  Once 09/21/21 1328 09/21/21 1344  ? ?  ? ? ? ? ? ?DVT prophylaxis: Lovenox ? ?Code Status: Full code ? ?Family Communication: No family at bedside ? ?CONSULTS: ? ? ? ?Objective  ? ? ?Physical Examination: ? ? ?General-appears in no acute distress ?Heart-S1-S2, regular, no murmur auscultated ?Lungs-clear to auscultation bilaterally, no wheezing or crackles auscultated ?Abdomen-soft, nontender, no organomegaly ?Extremities-no edema in the lower extremities ?Neuro-alert, oriented x3, no focal deficit noted ? ?Status is: Inpatient:   Diabetic ketoacidosis ? ? ? ?  ? ?Oswald Hillock ?  ?[facility] ?If 7PM-7AM, please contact night-coverage at [email or web address], ?Office  [phone] ? ? ?09/25/2021, 3:00 PM  LOS: 3 days  ? ? ? ? ? ? ? ? ? ? ?  ?

## 2021-09-26 DIAGNOSIS — R1310 Dysphagia, unspecified: Secondary | ICD-10-CM

## 2021-09-26 DIAGNOSIS — E871 Hypo-osmolality and hyponatremia: Secondary | ICD-10-CM

## 2021-09-26 LAB — BASIC METABOLIC PANEL
Anion gap: 16 — ABNORMAL HIGH (ref 5–15)
Anion gap: 12 (ref 5–15)
BUN: 13 mg/dL (ref 6–20)
BUN: 14 mg/dL (ref 6–20)
CO2: 19 mmol/L — ABNORMAL LOW (ref 22–32)
CO2: 21 mmol/L — ABNORMAL LOW (ref 22–32)
Calcium: 8.7 mg/dL — ABNORMAL LOW (ref 8.9–10.3)
Calcium: 8.7 mg/dL — ABNORMAL LOW (ref 8.9–10.3)
Chloride: 100 mmol/L (ref 98–111)
Chloride: 101 mmol/L (ref 98–111)
Creatinine, Ser: 0.85 mg/dL (ref 0.61–1.24)
Creatinine, Ser: 0.86 mg/dL (ref 0.61–1.24)
GFR, Estimated: >60 mL/min (ref >60)
GFR, Estimated: >60 mL/min (ref >60)
Glucose, Bld: 221 mg/dL — ABNORMAL HIGH (ref 70–99)
Glucose, Bld: 297 mg/dL — ABNORMAL HIGH (ref 70–99)
Potassium: 3.5 mmol/L (ref 3.5–5.1)
Potassium: 3.3 mmol/L — ABNORMAL LOW (ref 3.5–5.1)
Sodium: 135 mmol/L (ref 135–145)
Sodium: 134 mmol/L — ABNORMAL LOW (ref 135–145)

## 2021-09-26 LAB — CULTURE, BLOOD (ROUTINE X 2)
Culture: NO GROWTH
Culture: NO GROWTH

## 2021-09-26 LAB — GLUCOSE, CAPILLARY
Glucose-Capillary: 222 mg/dL — ABNORMAL HIGH (ref 70–99)
Glucose-Capillary: 306 mg/dL — ABNORMAL HIGH (ref 70–99)

## 2021-09-26 MED ORDER — INSULIN GLARGINE-YFGN 100 UNIT/ML ~~LOC~~ SOLN
12.0000 [IU] | Freq: Two times a day (BID) | SUBCUTANEOUS | Status: DC
  Filled 2021-09-26: qty 0.12

## 2021-09-26 MED ORDER — PANTOPRAZOLE SODIUM 40 MG PO TBEC: 40.0000 mg | DELAYED_RELEASE_TABLET | Freq: Every day | ORAL | 1 refills | Status: AC

## 2021-09-26 MED ORDER — POTASSIUM CHLORIDE CRYS ER 20 MEQ PO TBCR
40.0000 meq | EXTENDED_RELEASE_TABLET | Freq: Once | ORAL | Status: AC
  Administered 2021-09-26: 40 meq via ORAL
  Filled 2021-09-26: qty 2

## 2021-09-26 MED ORDER — METFORMIN HCL 500 MG PO TABS
500.0000 mg | ORAL_TABLET | Freq: Two times a day (BID) | ORAL | 11 refills | Status: AC
Start: 2021-09-26 — End: 2022-09-26

## 2021-09-26 MED ORDER — INSULIN GLARGINE 100 UNIT/ML SOLOSTAR PEN: 25.0000 [IU] | PEN_INJECTOR | Freq: Every day | SUBCUTANEOUS | 11 refills | Status: AC

## 2021-09-26 MED ORDER — METOPROLOL TARTRATE 25 MG PO TABS: 25.0000 mg | ORAL_TABLET | Freq: Two times a day (BID) | ORAL | 2 refills | Status: AC

## 2021-09-26 MED ORDER — LISINOPRIL 10 MG PO TABS: 10.0000 mg | ORAL_TABLET | Freq: Every day | ORAL | 2 refills | Status: AC

## 2021-09-26 NOTE — Discharge Summary (Signed)
?Physician Discharge Summary ?  ?Patient: Brent Blake MRN: [id number] DOB: 08-Mar-1991  ?Admit date:     09/21/2021  ?Discharge date: 09/26/21  ?Discharge Physician: Meredeth IdeGagan S Christalyn Goertz  ? ?PCP: Claiborne RiggFleming, Zelda W, NP  ? ?Recommendations at discharge:  ? ?Follow-up PCP in 1 week ? ?Discharge Diagnoses: ?Principal Problem: ?  Uncontrolled type 2 diabetes mellitus with ketoacidosis Brent coma, without long-term current use of insulin (HCC) ?Active Problems: ?  Acute renal failure (HCC) ?  Severe sepsis (HCC) ?  Community acquired pneumonia of left lower lobe of lung ?  Hyperkalemia ?  Hematemesis ?  Acute metabolic encephalopathy ?  Asthma, chronic ?  Essential hypertension ?  Obesity (BMI 30-39.9) ?  Hyponatremia ?  Hypokalemia ?  DKA (diabetic ketoacidosis) (HCC) ?  Dysphagia ? ?Resolved Problems: ?  * No resolved hospital problems. * ? ?Hospital Course: ?Brent Blake is a 31 y.o. M with T2DM never on Blake, Brent Blake, Brent Blake, Brent Blake, Brent Blake, Brent Blake.' ? ?All history collected from partner at bedside.  Evidently, few days ago, was coughing real bad, Brent Blake all over the house.  Partner took him to her house, was trying to treat him there, but eventually realized Brent told him this was "Not no regular cold, you got to go the hospital."  Unfortunately, by htat time he was confused Brent couldn't even stand, so EMS were activated.   ? ?She thinks he has a history of diabetes Brent hypertension.  I also see albuterol listed in his prior meds.    ? ?In the ER, Blood sugar >1000, anion gap 36, Bicarb 7, VBG showed pH 7.1.  He appeared lethargic Brent unresponsive.  CXR had a left base opacity.  He had reported hematemesis.  Was given 2L fluids, started on insulin drip Brent antibiotics. ? ? ? ? ?Assessment Brent Plan: ? ?* Uncontrolled type 2 diabetes mellitus with ketoacidosis Brent coma, without long-term current use of insulin (HCC) ?Diabetes noted in chart Brent far back Brent 2018, partner  says she has never seen him take medicine int he time she has known him (~2374yr) Brent I see no record in notes of ever being prescribed medicine. ? ?-Presented with severe DKA Brent altered mental status ?-DKA has resolved ?-Mental status back to baseline ?-IV insulin has been discontinued ?-Patient started on Lantus 10 units subcu twice daily, sliding scale insulin with NovoLog ?-NovoLog 3 units 3 times daily with meals ?-We will discharge on Lantus 25 units subcu daily, metformin 500 mg p.o. twice daily ?Patient to follow-up with PCP in 1 week ? ? ? ? ? ? ? ?Acute renal failure (HCC) ?Cr 3.9 from baseline in 2021 of 0.9.   ?- Started on IV fluids ?-Creatinine has improved to 1.10 ? ?Severe sepsis (HCC) ?Presented with hypothermia, tachycardia leukocytosis Brent LLL opacity with renal failure Brent encephalopathy.  Cannot rule out this is severe sepsis. ? ?- Patient was started on  Rocephin Brent azithromycin ?- Repeat lactate is 1.5 ?- Blood cultures x2 shows no growth in 5 days ? ?Community acquired pneumonia of left lower lobe of lung ?- Chest x-ray obtained  showed bibasilar opacities ?-Patient has completed 5 days of Rocephin Brent Zithromax ? ? ?Dysphagia ?- Patient was not swallowing due to mental status change/DKA ?-Swallow evaluation obtained, initially he was kept n.p.o. ?-After mental status improved patient is able to swallow well ?-Started on regular diet ? ?Hypokalemia ?- Potassium is 3.3 ?-We will give  K-Dur 40 mg p.o. x1 before discharge ? ? ?Hyponatremia ?- Sodium improved to 134 ? ?Obesity (BMI 30-39.9) ?BMI 33 ? ?Essential hypertension ?- Blood pressure has been elevated ?-Started on metoprolol 25 mg p.o. twice daily, lisinopril 10 mg daily ? ?Asthma, chronic ?- Stable ? ?Acute metabolic encephalopathy ?Due to DKA Brent/or sepsis.   ?-Resolved ?-Serum osmolality was high at 324 ?-Repeat serum osmolality 301 from 09/23/2021 ? ? ?Hematemesis ?Reported hematemesis in ER with bright red streaking.   Probably  Mallory-Weiss tear given the extent of Brent Blake per partner, low suspicion at present for esophageal varices or gastric ulcer. ?- Obtained CT chest/CT neck today ?-Only shows thickening of esophagus consistent with esophagitis ?-We will continue Protonix 40 mg daily for 6 to 8 weeks ? ? ? ? ? ?  ? ? ?Consultants:  ?Procedures performed:  ?Disposition: Home ?Diet recommendation:  ?Discharge Diet Orders (From admission, onward)  ? ?  Start     Ordered  ? 09/26/21 0000  Diet - low sodium heart healthy       ? 09/26/21 1340  ? ?  ?  ? ?  ? ?Regular diet ?DISCHARGE MEDICATION: ?Allergies Brent of 09/26/2021   ?No Known Allergies ?  ? ?  ?Medication List  ?  ? ?TAKE these medications   ? ?insulin glargine 100 UNIT/ML Solostar Pen ?Commonly known Brent: LANTUS ?Inject 25 Units into the skin at bedtime. ?  ?lisinopril 10 MG tablet ?Commonly known Brent: ZESTRIL ?Take 1 tablet (10 mg total) by mouth daily. ?Start taking on: September 27, 2021 ?  ?metFORMIN 500 MG tablet ?Commonly known Brent: Glucophage ?Take 1 tablet (500 mg total) by mouth 2 (two) times daily with a meal. ?  ?metoprolol tartrate 25 MG tablet ?Commonly known Brent: LOPRESSOR ?Take 1 tablet (25 mg total) by mouth 2 (two) times daily. ?  ?pantoprazole 40 MG tablet ?Commonly known Brent: Protonix ?Take 1 tablet (40 mg total) by mouth daily. ?  ? ?  ? ? Follow-up Information   ? ? Claiborne Rigg, NP Follow up in 1 week(s).   ?Specialty: Nurse Practitioner ?Contact information: ?[address] [place] ?[address] ?[place] [address] ?[phone] ? ? ?  ?  ? ?  ?  ? ?  ? ?Discharge Exam: ?Filed Weights  ? 09/21/21 1233 09/25/21 1815  ?Weight: 117.9 kg 109.1 kg  ? ?General-appears in no acute distress ?Heart-S1-S2, regular, no murmur auscultated ?Lungs-clear to auscultation bilaterally, no wheezing or crackles auscultated ?Abdomen-soft, nontender, no organomegaly ?Extremities-no edema in the lower extremities ?Neuro-alert, oriented x3, no focal deficit noted ? ?Condition at discharge:  good ? ?The results of significant diagnostics from this hospitalization (including imaging, microbiology, ancillary Brent laboratory) are listed below for reference.  ? ?Imaging Studies: ?CT SOFT TISSUE NECK W CONTRAST ? ?Result Date: 09/23/2021 ?CLINICAL DATA:  Soft tissue swelling, infection suspected, persistent Brent Blake-? hematemesis EXAM: CT NECK WITH CONTRAST TECHNIQUE: Multidetector CT imaging of the neck was performed using the standard protocol following the bolus administration of intravenous contrast. RADIATION DOSE REDUCTION: This exam was performed according to the departmental dose-optimization program which includes automated exposure control, adjustment of the mA Brent/or kV according to patient size Brent/or use of iterative reconstruction technique. CONTRAST:  OMNIPAQUE IOHEXOL 300 MG/ML  SOLN COMPARISON:  None. FINDINGS: Pharynx Brent larynx: Unremarkable.  No mass or swelling. Salivary glands: Parotid Brent submandibular glands are unremarkable. Thyroid: Unremarkable. Lymph nodes: No enlarged or abnormal density nodes. Vascular: Unremarkable. Limited intracranial: No abnormal  enhancement. Visualized orbits: Unremarkable. Mastoids Brent visualized paranasal sinuses: Small right maxillary sinus retention cyst. Mastoid air cells are clear. Skeleton: No significant abnormality. Upper chest: Dictated separately. Other: None. IMPRESSION: No acute abnormality in the neck. Electronically Signed   By: Guadlupe Spanish M.D.   On: 09/23/2021 10:56  ? ?CT CHEST W CONTRAST ? ?Result Date: 09/23/2021 ?CLINICAL DATA:  A 31 year old male presents for evaluation of hemoptysis. EXAM: CT CHEST WITH CONTRAST TECHNIQUE: Multidetector CT imaging of the chest was performed during intravenous contrast administration. RADIATION DOSE REDUCTION: This exam was performed according to the departmental dose-optimization program which includes automated exposure control, adjustment of the mA Brent/or kV according to patient size Brent/or  use of iterative reconstruction technique. CONTRAST:  OMNIPAQUE IOHEXOL 300 MG/ML  SOLN COMPARISON:  Neck CT of the same date. FINDINGS: Cardiovascular: Heart size is normal without pericardial effusion.

## 2021-09-26 NOTE — Progress Notes (Signed)
Inpatient Diabetes Program Recommendations ? ?AACE/ADA: New Consensus Statement on Inpatient Glycemic Control (2015) ? ?Target Ranges:  Prepandial:   less than 140 mg/dL ?     Peak postprandial:   less than 180 mg/dL (1-2 hours) ?     Critically ill patients:  140 - 180 mg/dL  ? ?Lab Results  ?Component Value Date  ? GLUCAP 306 (H) 09/26/2021  ? HGBA1C 13.8 (H) 09/21/2021  ? ? ?Review of Glycemic Control ? Latest Reference Range & Units 09/25/21 11:55 09/25/21 16:10 09/25/21 20:51 09/26/21 07:27 09/26/21 11:47  ?Glucose-Capillary 70 - 99 mg/dL 315 (H) 195 (H) 366 (H) 222 (H) 306 (H)  ?(H): Data is abnormally high ? ?Diabetes history: DM2 ?Outpatient Diabetes medications: None ?Current orders for Inpatient glycemic control: Semglee 10 units bid, Novolog 0-15 units TID with meals and 0-5 HS ? ?Inpatient Diabetes Program Recommendations:   ?Please consider: ?-Increase Semglee to 12 bid ?-Increase Novolog meal coverage to 4 units tid if eats 50% ?Secure chat sent to Dr. Darrick Meigs. ? ?Thank you, ?Nani Gasser Caroleena Paolini, RN, MSN, CDE  ?Diabetes Coordinator ?Inpatient Glycemic Control Team ?Team Pager [phone] (8am-5pm) ?09/26/2021 11:57 AM ? ? ? ? ?

## 2021-09-26 NOTE — Progress Notes (Signed)
Patient has been taught and explained discharge instructions. Patient has no further questions at this time. IV has been removed and site is clean, dry and intact. ? ?Jie Stickels, RN ?

## 2021-09-27 ENCOUNTER — Telehealth: Payer: Self-pay

## 2021-09-27 NOTE — Telephone Encounter (Signed)
Transition Care Management Unsuccessful Follow-up Telephone Call ? ?Date of discharge and from where:  09/26/2021, [facility]  ? ?Attempts:  1st Attempt ? ?Reason for unsuccessful TCM follow-up call:  Left voice message # [phone]. Call back requested. Called # [phone] and the recording stated that the number cannot be dialed.  ? ? ? ?

## 2021-09-28 ENCOUNTER — Telehealth: Payer: Self-pay

## 2021-09-28 NOTE — Telephone Encounter (Signed)
Transition Care Management Unsuccessful Follow-up Telephone Call ? ?Date of discharge and from where:  09/26/2021, [facility]  ? ?Attempts:  2nd Attempt ? ?Reason for unsuccessful TCM follow-up call:  Unable to reach patient # [phone] - recording stated that the number is no longer in service.  ?I also called # [phone] - the recording stated that the number cannot be dialed.  ? ?Patient has appointment scheduled with Dr Alvis Lemmings @ [facility] - 323/2023.  ? ? ? ?

## 2021-09-29 ENCOUNTER — Telehealth: Payer: Self-pay

## 2021-09-29 NOTE — Telephone Encounter (Signed)
Transition Care Management Unsuccessful Follow-up Telephone Call ? ?Date of discharge and from where:  09/26/2021, [facility]  ? ?Attempts:  3rd Attempt ? ?Reason for unsuccessful TCM follow-up call:  Unable to reach patient  # [phone] - recording stated that the number is no longer in service.  ?I also called # [phone] - the recording stated that the number cannot be dialed.  ?  ?Patient has appointment scheduled with Dr Alvis Lemmings @ [facility] - 09/30/2021.    ?  ? ? ?

## 2021-09-30 ENCOUNTER — Inpatient Hospital Stay: Payer: Self-pay | Admitting: Family Medicine

## 2021-11-01 ENCOUNTER — Inpatient Hospital Stay: Payer: Self-pay | Admitting: Nurse Practitioner

## 2021-11-02 ENCOUNTER — Inpatient Hospital Stay: Payer: Self-pay | Admitting: Family Medicine

## 2021-11-08 DEATH — deceased
